# Patient Record
Sex: Female | Born: 1960
Health system: Southern US, Community
[De-identification: ages and names within clinical notes are randomized; demographics above are authoritative.]

## PROBLEM LIST (undated history)

## (undated) HISTORY — PX: HERNIA REPAIR: SHX51

## (undated) HISTORY — PX: KNEE SURGERY: SHX244

---

## 1998-04-25 ENCOUNTER — Other Ambulatory Visit: Admission: RE | Admit: 1998-04-25 | Discharge: 1998-04-25 | Payer: Self-pay | Admitting: Obstetrics and Gynecology

## 1999-05-23 ENCOUNTER — Other Ambulatory Visit: Admission: RE | Admit: 1999-05-23 | Discharge: 1999-05-23 | Payer: Self-pay | Admitting: Obstetrics and Gynecology

## 2000-07-08 ENCOUNTER — Other Ambulatory Visit: Admission: RE | Admit: 2000-07-08 | Discharge: 2000-07-08 | Payer: Self-pay | Admitting: Obstetrics and Gynecology

## 2001-08-11 ENCOUNTER — Other Ambulatory Visit: Admission: RE | Admit: 2001-08-11 | Discharge: 2001-08-11 | Payer: Self-pay | Admitting: Obstetrics and Gynecology

## 2002-08-25 ENCOUNTER — Other Ambulatory Visit: Admission: RE | Admit: 2002-08-25 | Discharge: 2002-08-25 | Payer: Self-pay | Admitting: Obstetrics and Gynecology

## 2002-10-12 ENCOUNTER — Encounter: Payer: Self-pay | Admitting: Obstetrics and Gynecology

## 2002-10-12 ENCOUNTER — Encounter: Admission: RE | Admit: 2002-10-12 | Discharge: 2002-10-12 | Payer: Self-pay | Admitting: Obstetrics and Gynecology

## 2013-08-31 ENCOUNTER — Emergency Department: Payer: Self-pay | Admitting: Emergency Medicine

## 2013-08-31 LAB — URINALYSIS, COMPLETE
Bacteria: NONE SEEN
Bilirubin,UR: NEGATIVE
Blood: NEGATIVE
Glucose,UR: NEGATIVE mg/dL (ref 0–75)
Nitrite: NEGATIVE
Ph: 8 (ref 4.5–8.0)
RBC,UR: 16 /HPF (ref 0–5)
Squamous Epithelial: 9

## 2013-08-31 LAB — COMPREHENSIVE METABOLIC PANEL
Albumin: 3.6 g/dL (ref 3.4–5.0)
Alkaline Phosphatase: 61 U/L
Anion Gap: 10 (ref 7–16)
Bilirubin,Total: 0.4 mg/dL (ref 0.2–1.0)
Calcium, Total: 9 mg/dL (ref 8.5–10.1)
Chloride: 104 mmol/L (ref 98–107)
Creatinine: 0.7 mg/dL (ref 0.60–1.30)
EGFR (Non-African Amer.): >60
Glucose: 128 mg/dL — ABNORMAL HIGH (ref 65–99)
Potassium: 3 mmol/L — ABNORMAL LOW (ref 3.5–5.1)

## 2013-08-31 LAB — CBC
HCT: 35.9 % (ref 35.0–47.0)
MCH: 28.4 pg (ref 26.0–34.0)
MCHC: 33.4 g/dL (ref 32.0–36.0)
MCV: 85 fL (ref 80–100)
Platelet: 299 10*3/uL (ref 150–440)
RDW: 13 % (ref 11.5–14.5)
WBC: 9.7 10*3/uL (ref 3.6–11.0)

## 2013-08-31 LAB — LIPASE, BLOOD: Lipase: 138 U/L (ref 73–393)

## 2013-08-31 LAB — TROPONIN I: Troponin-I: <0.02 ng/mL

## 2014-11-12 ENCOUNTER — Ambulatory Visit (INDEPENDENT_AMBULATORY_CARE_PROVIDER_SITE_OTHER): Payer: Self-pay | Admitting: Surgery

## 2014-11-12 NOTE — H&P (Signed)
Karina Swanson. Schrimpf 11/12/2014 10:32 AM Location: [facility] Patient #: [id number] DOB: November 04, 1960 Married / Language: [name] / Race: White Female History of Present Illness Karina Swanson A. Karina Swanson; 11/12/2014 10:42 AM) Patient words: hernia.  The patient is a 54 year old female who presents with an inguinal hernia. The hernia(s) is/are located on the right side. No changes in management were made at the last visit. Symptoms include inguinal bulge, while symptoms do not include inguinal pain. Onset was gradual 2 week(s) ago. There is no known event that preceded symptom onset. The patient describes this as mild. Past Surgical History Karina Swanson, Swanson; 11/12/2014 10:32 AM) Cesarean Section - 1  Diagnostic Studies History Karina Swanson, Swanson; 11/12/2014 10:32 AM) Colonoscopy never Mammogram 1-3 years ago Pap Smear 1-5 years ago  Allergies Karina Swanson, Swanson; 11/12/2014 10:33 AM) Codeine Phosphate *ANALGESICS - OPIOID*  Medication History Karina Swanson, Swanson; 11/12/2014 10:33 AM) No Current Medications Medications Reconciled  Social History Karina Swanson, Swanson; 11/12/2014 10:32 AM) Caffeine use Coffee. No alcohol use No drug use Tobacco use Never smoker.  Family History Karina Swanson, Swanson; 11/12/2014 10:32 AM) Breast Cancer Mother.  Pregnancy / Birth History Karina Swanson, Swanson; 11/12/2014 10:32 AM) Age at menarche 12 years. Age of menopause 15-55 Gravida 1 Maternal age 62-25 Para 1     Review of Systems Karina Swanson Swanson; 11/12/2014 10:32 AM) General Not Present- Appetite Loss, Chills, Fatigue, Fever, Night Sweats, Weight Gain and Weight Loss. Skin Not Present- Change in Wart/Mole, Dryness, Hives, Jaundice, New Lesions, Non-Healing Wounds, Rash and Ulcer. HEENT Not Present- Earache, Hearing Loss, Hoarseness, Nose Bleed, Oral Ulcers, Ringing in the Ears, Seasonal Allergies, Sinus Pain, Sore Throat, Visual Disturbances, Wears glasses/contact lenses and Yellow  Eyes. Respiratory Not Present- Bloody sputum, Chronic Cough, Difficulty Breathing, Snoring and Wheezing. Breast Not Present- Breast Mass, Breast Pain, Nipple Discharge and Skin Changes. Cardiovascular Not Present- Chest Pain, Difficulty Breathing Lying Down, Leg Cramps, Palpitations, Rapid Heart Rate, Shortness of Breath and Swelling of Extremities. Gastrointestinal Not Present- Abdominal Pain, Bloating, Bloody Stool, Change in Bowel Habits, Chronic diarrhea, Constipation, Difficulty Swallowing, Excessive gas, Gets full quickly at meals, Hemorrhoids, Indigestion, Nausea, Rectal Pain and Vomiting. Female Genitourinary Not Present- Frequency, Nocturia, Painful Urination, Pelvic Pain and Urgency. Musculoskeletal Not Present- Back Pain, Joint Pain, Joint Stiffness, Muscle Pain, Muscle Weakness and Swelling of Extremities.  Vitals (Karina Swanson; 11/12/2014 10:33 AM) 11/12/2014 10:32 AM Weight: 117 lb Height: 60in Body Surface Area: 1.5 m Body Mass Index: 22.85 kg/m Temp.: 97.61F(Temporal)  Pulse: 77 (Regular)  BP: 124/72 (Sitting, Left Arm, Standard)     Physical Exam (Karina Swanson A. Karina Swanson; 11/12/2014 10:43 AM)  General Mental Status-Alert. General Appearance-Consistent with stated age. Hydration-Well hydrated. Voice-Normal.  Head and Neck Head-normocephalic, atraumatic with no lesions or palpable masses. Trachea-midline. Thyroid Gland Characteristics - normal size and consistency.  Chest and Lung Exam Chest and lung exam reveals -quiet, even and easy respiratory effort with no use of accessory muscles and on auscultation, normal breath sounds, no adventitious sounds and normal vocal resonance. Inspection Chest Wall - Normal. Back - normal.  Cardiovascular Cardiovascular examination reveals -normal heart sounds, regular rate and rhythm with no murmurs and normal pedal pulses bilaterally.  Abdomen Note: rRIGHT INGUINAL HERNIA REDUCIBLE NON  TENDER    Neurologic Neurologic evaluation reveals -alert and oriented x 3 with no impairment of recent or remote memory. Mental Status-Normal.  Musculoskeletal Normal Exam - Left-Upper Extremity Strength Normal and Lower Extremity Strength Normal. Normal Exam -  Right-Upper Extremity Strength Normal and Lower Extremity Strength Normal.  Lymphatic Head & Neck  General Head & Neck Lymphatics: Bilateral - Description - Normal. Axillary  General Axillary Region: Bilateral - Description - Normal. Tenderness - Non Tender. Femoral & Inguinal  Generalized Femoral & Inguinal Lymphatics: Bilateral - Description - Normal. Tenderness - Non Tender.    Assessment & Plan (Karina Swanson A. Karina Swanson; 11/12/2014 10:45 AM)  RIGHT INGUINAL HERNIA (550.90  K40.90) Impression: DISCUSSED OBSERVATION VS REPAIR.Karina Swanson. The risk of hernia repair include bleeding, infection, organ injury, bowel injury, bladder injury, nerve injury recurrent hernia, blood clots, worsening of underlying condition, chronic pain, mesh use, open surgery, death, and the need for other operattions. Pt agrees to proceed  Current Plans Pt Education - [facility] Umbilical/ Inguinal Hernia [facility]CI

## 2016-02-14 ENCOUNTER — Ambulatory Visit: Payer: BLUE CROSS/BLUE SHIELD | Admitting: Sports Medicine

## 2016-02-16 ENCOUNTER — Ambulatory Visit: Payer: Self-pay | Admitting: Podiatry

## 2017-02-13 ENCOUNTER — Ambulatory Visit: Payer: BLUE CROSS/BLUE SHIELD | Admitting: Podiatry

## 2018-02-12 ENCOUNTER — Other Ambulatory Visit: Payer: Self-pay

## 2018-02-12 ENCOUNTER — Ambulatory Visit: Payer: BLUE CROSS/BLUE SHIELD | Admitting: Podiatry

## 2018-02-12 ENCOUNTER — Encounter: Payer: Self-pay | Admitting: Podiatry

## 2018-02-12 DIAGNOSIS — B351 Tinea unguium: Secondary | ICD-10-CM

## 2018-02-12 NOTE — Progress Notes (Signed)
   Subjective:    Patient ID: Karina Swanson, female    DOB: 09/09/1961, 57 y.o.   MRN: [id number]  HPI    Review of Systems  All other systems reviewed and are negative.      Objective:   Physical Exam        Assessment & Plan:

## 2018-02-14 NOTE — Progress Notes (Signed)
Subjective:   Patient ID: Karina ArcherAlexandra Tobia, female   DOB: 57 y.o.   MRN: [id number]   HPI Patient presents stating that there is been discoloration of the second nail left and other nails are slightly discolored and she is interested in laser treatment.  Patient does not want oral medication   ROS      Objective:  Physical Exam  Neurovascular status intact with patient noted to have thickness of the second nail left is localized with no proximal edema erythema or drainage noted     Assessment:  Probability of trauma with possible mycotic nail infection also present     Plan:  Reviewed condition with patient and recommended that we try laser even though I explained trauma and there is no guarantee that this will make a big difference for the patient.  Patient wants to pursue this treatment option and at this time is scheduled for laser therapy

## 2018-02-17 ENCOUNTER — Ambulatory Visit: Payer: Self-pay

## 2018-02-17 DIAGNOSIS — B351 Tinea unguium: Secondary | ICD-10-CM

## 2018-02-21 NOTE — Progress Notes (Signed)
Pt presents with mycotic infection of nails 1-5 bilateral  All other systems are negative  Laser therapy administered to affected nails and tolerated well. All safety precautions were in place. Re-appointed in 4 weeks for 2nd treatment 

## 2018-03-03 ENCOUNTER — Ambulatory Visit (INDEPENDENT_AMBULATORY_CARE_PROVIDER_SITE_OTHER): Payer: BLUE CROSS/BLUE SHIELD

## 2018-03-03 DIAGNOSIS — B351 Tinea unguium: Secondary | ICD-10-CM

## 2018-03-05 NOTE — Progress Notes (Signed)
Pt presents with mycotic infection of nails 1-5 bilateral  All other systems are negative  Laser therapy administered to affected nails and tolerated well. All safety precautions were in place. Re-appointed in 4 weeks for 3rd treatment 

## 2018-05-28 ENCOUNTER — Ambulatory Visit: Payer: Self-pay

## 2018-05-28 DIAGNOSIS — B351 Tinea unguium: Secondary | ICD-10-CM

## 2018-05-30 MED ORDER — NONFORMULARY OR COMPOUNDED ITEM: 1.0000 g | Freq: Every day | 3 refills | Status: DC

## 2018-05-30 NOTE — Addendum Note (Signed)
Addended by: Marylou MccoyQUINTANA, Ginamarie Banfield L on: 05/30/2018 04:07 PM   Modules accepted: Orders

## 2018-05-30 NOTE — Progress Notes (Signed)
Pt presents with mycotic infection of nails 1-5 bilateral  All other systems are negative  Laser therapy administered to affected nails and tolerated well. All safety precautions were in place. Re-appointed in 4 weeks for 4th treatment 

## 2018-06-25 ENCOUNTER — Other Ambulatory Visit: Payer: BLUE CROSS/BLUE SHIELD

## 2018-06-30 ENCOUNTER — Other Ambulatory Visit: Payer: BLUE CROSS/BLUE SHIELD

## 2018-12-08 ENCOUNTER — Telehealth: Payer: Self-pay | Admitting: Podiatry

## 2018-12-08 MED ORDER — NONFORMULARY OR COMPOUNDED ITEM: 5 refills | Status: DC

## 2018-12-08 NOTE — Telephone Encounter (Signed)
Left message informing pt Dr. Charlsie Merles ordered her toenail compound, it would come from [place] [phone], they would call with coverage and delivery information. Faxed orders to [facility].

## 2018-12-08 NOTE — Addendum Note (Signed)
Addended by: Alphia Kava D on: 12/08/2018 01:33 PM   Modules accepted: Orders

## 2018-12-08 NOTE — Telephone Encounter (Signed)
Patient got laser last year, and now she wants to come back to get laser but until we start back doing laser in the office she would like to know what to do in the meantime. She is requesting a prescription.

## 2019-07-01 ENCOUNTER — Ambulatory Visit: Payer: BC Managed Care – PPO | Admitting: Podiatry

## 2019-08-24 ENCOUNTER — Other Ambulatory Visit: Payer: Self-pay | Admitting: Family Medicine

## 2019-08-24 DIAGNOSIS — Z1231 Encounter for screening mammogram for malignant neoplasm of breast: Secondary | ICD-10-CM

## 2020-05-30 ENCOUNTER — Emergency Department: Payer: BC Managed Care – PPO

## 2020-05-30 ENCOUNTER — Encounter: Payer: Self-pay | Admitting: Emergency Medicine

## 2020-05-30 ENCOUNTER — Other Ambulatory Visit: Payer: Self-pay

## 2020-05-30 ENCOUNTER — Inpatient Hospital Stay
Admission: EM | Admit: 2020-05-30 | Discharge: 2020-06-06 | DRG: 640 | Disposition: A | Discharge status: Home / Self Care | Payer: BC Managed Care – PPO | Attending: Internal Medicine | Admitting: Internal Medicine

## 2020-05-30 DIAGNOSIS — E871 Hypo-osmolality and hyponatremia: Secondary | ICD-10-CM | POA: Diagnosis present

## 2020-05-30 DIAGNOSIS — Z79899 Other long term (current) drug therapy: Secondary | ICD-10-CM

## 2020-05-30 DIAGNOSIS — J9621 Acute and chronic respiratory failure with hypoxia: Secondary | ICD-10-CM | POA: Diagnosis not present

## 2020-05-30 DIAGNOSIS — R4182 Altered mental status, unspecified: Secondary | ICD-10-CM

## 2020-05-30 DIAGNOSIS — G9341 Metabolic encephalopathy: Secondary | ICD-10-CM | POA: Diagnosis present

## 2020-05-30 DIAGNOSIS — U071 COVID-19: Secondary | ICD-10-CM | POA: Diagnosis not present

## 2020-05-30 DIAGNOSIS — R630 Anorexia: Secondary | ICD-10-CM | POA: Diagnosis present

## 2020-05-30 DIAGNOSIS — J9601 Acute respiratory failure with hypoxia: Secondary | ICD-10-CM | POA: Diagnosis not present

## 2020-05-30 DIAGNOSIS — E876 Hypokalemia: Secondary | ICD-10-CM | POA: Diagnosis not present

## 2020-05-30 DIAGNOSIS — Z885 Allergy status to narcotic agent status: Secondary | ICD-10-CM

## 2020-05-30 DIAGNOSIS — E860 Dehydration: Secondary | ICD-10-CM | POA: Diagnosis present

## 2020-05-30 DIAGNOSIS — E861 Hypovolemia: Secondary | ICD-10-CM | POA: Diagnosis present

## 2020-05-30 LAB — CBC WITH DIFFERENTIAL/PLATELET
Abs Immature Granulocytes: 0.07 10*3/uL (ref 0.00–0.07)
Basophils Absolute: 0 10*3/uL (ref 0.0–0.1)
Basophils Relative: 0 %
Eosinophils Absolute: 0 10*3/uL (ref 0.0–0.5)
Eosinophils Relative: 0 %
HCT: 34.5 % — ABNORMAL LOW (ref 36.0–46.0)
Hemoglobin: 12.8 g/dL (ref 12.0–15.0)
Immature Granulocytes: 1 %
Lymphocytes Relative: 11 %
Lymphs Abs: 1.1 10*3/uL (ref 0.7–4.0)
MCH: 28.9 pg (ref 26.0–34.0)
MCHC: 37.1 g/dL — ABNORMAL HIGH (ref 30.0–36.0)
MCV: 77.9 fL — ABNORMAL LOW (ref 80.0–100.0)
Monocytes Absolute: 0.4 10*3/uL (ref 0.1–1.0)
Monocytes Relative: 4 %
Neutro Abs: 8.6 10*3/uL — ABNORMAL HIGH (ref 1.7–7.7)
Neutrophils Relative %: 84 %
Platelets: 255 10*3/uL (ref 150–400)
RBC: 4.43 MIL/uL (ref 3.87–5.11)
RDW: 11.5 % (ref 11.5–15.5)
WBC: 10.2 10*3/uL (ref 4.0–10.5)
nRBC: 0 % (ref 0.0–0.2)

## 2020-05-30 LAB — COMPREHENSIVE METABOLIC PANEL
ALT: 51 U/L — ABNORMAL HIGH (ref 0–44)
AST: 86 U/L — ABNORMAL HIGH (ref 15–41)
Albumin: 3.7 g/dL (ref 3.5–5.0)
Alkaline Phosphatase: 88 U/L (ref 38–126)
Anion gap: 13 (ref 5–15)
BUN: <5 mg/dL — ABNORMAL LOW (ref 6–20)
CO2: 25 mmol/L (ref 22–32)
Calcium: 8.3 mg/dL — ABNORMAL LOW (ref 8.9–10.3)
Chloride: 72 mmol/L — ABNORMAL LOW (ref 98–111)
Creatinine, Ser: 0.35 mg/dL — ABNORMAL LOW (ref 0.44–1.00)
GFR calc Af Amer: >60 mL/min (ref >60)
GFR calc non Af Amer: >60 mL/min (ref >60)
Glucose, Bld: 143 mg/dL — ABNORMAL HIGH (ref 70–99)
Potassium: 2.9 mmol/L — ABNORMAL LOW (ref 3.5–5.1)
Sodium: 110 mmol/L — CL (ref 135–145)
Total Bilirubin: 0.7 mg/dL (ref 0.3–1.2)
Total Protein: 7.1 g/dL (ref 6.5–8.1)

## 2020-05-30 LAB — SODIUM
Sodium: 115 mmol/L — CL (ref 135–145)
Sodium: 117 mmol/L — CL (ref 135–145)
Sodium: 118 mmol/L — CL (ref 135–145)

## 2020-05-30 LAB — URINALYSIS, COMPLETE (UACMP) WITH MICROSCOPIC
Bacteria, UA: NONE SEEN
Bilirubin Urine: NEGATIVE
Glucose, UA: 50 mg/dL — AB
Ketones, ur: 20 mg/dL — AB
Nitrite: NEGATIVE
Protein, ur: >=300 mg/dL — AB
Specific Gravity, Urine: 1.018 (ref 1.005–1.030)
pH: 6 (ref 5.0–8.0)

## 2020-05-30 LAB — PROCALCITONIN: Procalcitonin: 0.18 ng/mL

## 2020-05-30 LAB — SODIUM, URINE, RANDOM: Sodium, Ur: 19 mmol/L

## 2020-05-30 LAB — SARS CORONAVIRUS 2 BY RT PCR (HOSPITAL ORDER, PERFORMED IN ~~LOC~~ HOSPITAL LAB): SARS Coronavirus 2: POSITIVE — AB

## 2020-05-30 LAB — LIPID PANEL
Cholesterol: 147 mg/dL (ref 0–200)
HDL: 60 mg/dL (ref >40)
LDL Cholesterol: 69 mg/dL (ref 0–99)
Total CHOL/HDL Ratio: 2.5 RATIO
Triglycerides: 90 mg/dL (ref <150)
VLDL: 18 mg/dL (ref 0–40)

## 2020-05-30 LAB — OSMOLALITY, URINE: Osmolality, Ur: 457 mOsm/kg (ref 300–900)

## 2020-05-30 LAB — GLUCOSE, CAPILLARY: Glucose-Capillary: 149 mg/dL — ABNORMAL HIGH (ref 70–99)

## 2020-05-30 LAB — OSMOLALITY: Osmolality: 237 mOsm/kg — CL (ref 275–295)

## 2020-05-30 MED ORDER — ALBUTEROL SULFATE HFA 108 (90 BASE) MCG/ACT IN AERS
2.0000 | INHALATION_SPRAY | Freq: Once | RESPIRATORY_TRACT | Status: DC | PRN
  Filled 2020-05-30: qty 6.7

## 2020-05-30 MED ORDER — SODIUM CHLORIDE 0.9 % IV SOLN
INTRAVENOUS | Status: DC | PRN
  Administered 2020-06-03: 500 mL via INTRAVENOUS

## 2020-05-30 MED ORDER — ACETAMINOPHEN 325 MG PO TABS
650.0000 mg | ORAL_TABLET | ORAL | Status: DC | PRN
  Administered 2020-05-30 – 2020-05-31 (×2): 650 mg via ORAL
  Filled 2020-05-30 (×2): qty 2

## 2020-05-30 MED ORDER — METHYLPREDNISOLONE SODIUM SUCC 125 MG IJ SOLR: 125.0000 mg | Freq: Once | INTRAMUSCULAR | Status: DC | PRN

## 2020-05-30 MED ORDER — ONDANSETRON HCL 4 MG/2ML IJ SOLN: 4.0000 mg | Freq: Four times a day (QID) | INTRAMUSCULAR | Status: DC | PRN

## 2020-05-30 MED ORDER — SODIUM CHLORIDE 3 % IV SOLN
INTRAVENOUS | Status: DC
  Filled 2020-05-30: qty 500

## 2020-05-30 MED ORDER — SODIUM CHLORIDE 0.9 % IV SOLN
250.0000 mL | INTRAVENOUS | Status: DC | PRN
  Administered 2020-06-02: 250 mL via INTRAVENOUS

## 2020-05-30 MED ORDER — HEPARIN SODIUM (PORCINE) 5000 UNIT/ML IJ SOLN
5000.0000 [IU] | Freq: Three times a day (TID) | INTRAMUSCULAR | Status: DC
  Administered 2020-05-30 – 2020-06-02 (×3): 5000 [IU] via SUBCUTANEOUS
  Filled 2020-05-30 (×4): qty 1

## 2020-05-30 MED ORDER — POLYETHYLENE GLYCOL 3350 17 G PO PACK: 17.0000 g | PACK | Freq: Every day | ORAL | Status: DC | PRN

## 2020-05-30 MED ORDER — DOCUSATE SODIUM 100 MG PO CAPS: 100.0000 mg | ORAL_CAPSULE | Freq: Two times a day (BID) | ORAL | Status: DC | PRN

## 2020-05-30 MED ORDER — FAMOTIDINE IN NACL 20-0.9 MG/50ML-% IV SOLN
20.0000 mg | Freq: Two times a day (BID) | INTRAVENOUS | Status: DC
  Administered 2020-05-30 – 2020-06-02 (×3): 20 mg via INTRAVENOUS
  Filled 2020-05-30 (×5): qty 50

## 2020-05-30 MED ORDER — SODIUM CHLORIDE 0.9% FLUSH
3.0000 mL | Freq: Two times a day (BID) | INTRAVENOUS | Status: DC
  Administered 2020-05-31 – 2020-06-06 (×11): 3 mL via INTRAVENOUS

## 2020-05-30 MED ORDER — SODIUM CHLORIDE 0.9% FLUSH: 3.0000 mL | INTRAVENOUS | Status: DC | PRN

## 2020-05-30 MED ORDER — DIPHENHYDRAMINE HCL 50 MG/ML IJ SOLN: 50.0000 mg | Freq: Once | INTRAMUSCULAR | Status: DC | PRN

## 2020-05-30 MED ORDER — FAMOTIDINE IN NACL 20-0.9 MG/50ML-% IV SOLN: 20.0000 mg | Freq: Once | INTRAVENOUS | Status: DC | PRN

## 2020-05-30 MED ORDER — POTASSIUM CHLORIDE 10 MEQ/100ML IV SOLN
10.0000 meq | INTRAVENOUS | Status: AC
  Administered 2020-05-31 (×4): 10 meq via INTRAVENOUS
  Filled 2020-05-30 (×4): qty 100

## 2020-05-30 MED ORDER — EPINEPHRINE 0.3 MG/0.3ML IJ SOAJ
0.3000 mg | Freq: Once | INTRAMUSCULAR | Status: DC | PRN
  Filled 2020-05-30: qty 0.3

## 2020-05-30 MED ORDER — SODIUM CHLORIDE 0.9 % IV SOLN: 1200.0000 mg | Freq: Once | INTRAVENOUS | Status: DC

## 2020-05-30 NOTE — ED Notes (Signed)
Resumed care from [place] j rn.  This rn walked into room to find pt standing/ states I have to pee.  Pt taken off monitor and is using the BR.  Pt alert.  Iv in place.

## 2020-05-30 NOTE — ED Triage Notes (Addendum)
Pt here because son called EMS and felt she was confused this AM.  Had covid test last week but per pt has not gotten results.  Test was done at [facility] physicians.  Jeanene Erb and spoke with [facility] and report test done 9/16 but do not have pt results.  VSS at this time.  Unlabored respirations. Pt oriented and able to provide all information but at times is slow to answer.  Has had cough. No NVD. Poor appetite and decreased oral intake.  No pain. Warm dry and pink. Color WNL

## 2020-05-30 NOTE — ED Provider Notes (Signed)
[facility] Emergency Department Provider Note   ____________________________________________    I have reviewed the triage vital signs and the nursing notes.   HISTORY  Chief Complaint Altered Mental Status   History limited by altered mental status  HPI Karina Swanson is a 59 y.o. female who presents with reports of altered mental status.  Reportedly patient had been feeling ill and had Covid test performed by her family physician last week, the results of that test are not available.  Family became concerned because today patient was confused.  Patient does realize that she is having some difficulty with memory.  She denies loss of taste or smell.  Is having chills.  History reviewed. No pertinent past medical history.  There are no problems to display for this patient.   Past Surgical History:  Procedure Laterality Date  . CESAREAN SECTION    . HERNIA REPAIR    . KNEE SURGERY      Prior to Admission medications   Medication Sig Start Date End Date Taking? Authorizing Provider  acetaminophen (TYLENOL) 325 MG tablet Take by mouth. 04/11/17   [provider]  atorvastatin (LIPITOR) 40 MG tablet TK 1 T PO QD 03/27/16   [provider]  Cholecalciferol (VITAMIN D) 2000 units tablet Take by mouth.    [provider]  ketoconazole (NIZORAL) 2 % shampoo U UTD PRN 03/07/17   [provider]  NONFORMULARY OR COMPOUNDED ITEM Apply 1-2 g topically daily. [employer] Nail lacquer: Fluconazole 2%, Terbinafine 1%, DMSO 05/30/18   Lenn Sink, DPM  NONFORMULARY OR COMPOUNDED ITEM [facility]:  Antifungal Cream - Terbinafine 3%, Fluconazole 2%, Tea Tree Oil 5%, Urea 10%, Ibuprofen 2%, in DMSO #83ml suspension. Apply to affected toenail(s) once (at bedtime) or twice daily. 12/08/18   Lenn Sink, DPM  oxyCODONE (OXY IR/ROXICODONE) 5 MG immediate release tablet Take by mouth. 04/11/17   [provider]      Allergies Codeine  History reviewed. No pertinent family history.  Social History Social History   Tobacco Use  . Smoking status: Never Smoker  . Smokeless tobacco: Never Used  Substance Use Topics  . Alcohol use: Never  . Drug use: Never    Review of Systems limited by altered mental status  Constitutional: Positive chills Eyes: No visual changes.  ENT: No sore throat. Cardiovascular: Denies chest pain. Respiratory: No cough reported Gastrointestinal: No vomiting Genitourinary: No dysuria Musculoskeletal: Positive myalgias Skin: Negative for rash. Neurological: Negative for headaches    ____________________________________________   PHYSICAL EXAM:  VITAL SIGNS: ED Triage Vitals  Enc Vitals Group     BP 05/30/20 1146 130/64     Pulse Rate 05/30/20 1146 80     Resp 05/30/20 1146 16     Temp --      Temp src --      SpO2 05/30/20 1146 97 %     Weight 05/30/20 1149 54.4 kg (120 lb)     Height 05/30/20 1149 1.524 m (5')     Head Circumference --      Peak Flow --      Pain Score 05/30/20 1148 0     Pain Loc --      Pain Edu? --      Excl. in GC? --     Constitutional: Alert and oriented but slow to respond and somewhat confused Eyes: Conjunctivae are normal.  Head: Atraumatic. Nose: No congestion/rhinnorhea. Mouth/Throat: Mucous membranes are moist.  Cardiovascular: Normal rate, regular rhythm. Grossly normal heart sounds.  Good peripheral circulation. Respiratory: Normal respiratory effort.  No retractions. Lungs CTAB. Gastrointestinal: Soft and nontender. No distention.  No CVA tenderness.  Musculoskeletal: No lower extremity tenderness nor edema.  Warm and well perfused Neurologic:  Normal speech and language. No gross focal neurologic deficits are appreciated.  Cranial nerves II to XII are normal Skin:  Skin is warm, dry and intact. No rash noted. Psychiatric: Mood and affect are normal. Speech and behavior are  normal.  ____________________________________________   LABS (all labs ordered are listed, but only abnormal results are displayed)  Labs Reviewed  COMPREHENSIVE METABOLIC PANEL - Abnormal; Notable for the following components:      Result Value   Sodium 110 (*)    Potassium 2.9 (*)    Chloride 72 (*)    Glucose, Bld 143 (*)    BUN <5 (*)    Creatinine, Ser 0.35 (*)    Calcium 8.3 (*)    AST 86 (*)    ALT 51 (*)    All other components within normal limits  CBC WITH DIFFERENTIAL/PLATELET - Abnormal; Notable for the following components:   HCT 34.5 (*)    MCV 77.9 (*)    MCHC 37.1 (*)    Neutro Abs 8.6 (*)    All other components within normal limits  URINALYSIS, COMPLETE (UACMP) WITH MICROSCOPIC - Abnormal; Notable for the following components:   Color, Urine YELLOW (*)    APPearance HAZY (*)    Glucose, UA 50 (*)    Hgb urine dipstick SMALL (*)    Ketones, ur 20 (*)    Protein, ur >=300 (*)    Leukocytes,Ua TRACE (*)    All other components within normal limits  GLUCOSE, CAPILLARY - Abnormal; Notable for the following components:   Glucose-Capillary 149 (*)    All other components within normal limits  SARS CORONAVIRUS 2 BY RT PCR (HOSPITAL ORDER, PERFORMED IN  HOSPITAL LAB)  SODIUM, URINE, RANDOM  OSMOLALITY, URINE  SODIUM  SODIUM  SODIUM  OSMOLALITY   ____________________________________________  EKG  ED ECG REPORT I, Jene Every, the attending physician, personally viewed and interpreted this ECG.  Date: 05/30/2020  Rhythm: normal sinus rhythm QRS Axis: normal Intervals: normal ST/T Wave abnormalities: Nonspecific changes Narrative Interpretation: no evidence of acute ischemia  ____________________________________________  RADIOLOGY  Chest x-ray reviewed by me, no infiltrate effusion or pneumothorax ____________________________________________   PROCEDURES  Procedure(s) performed: No  Procedures   Critical Care performed:  yes  CRITICAL CARE Performed by: Jene Every   Total critical care time: 35 minutes  Critical care time was exclusive of separately billable procedures and treating other patients.  Critical care was necessary to treat or prevent imminent or life-threatening deterioration.  Critical care was time spent personally by me on the following activities: development of treatment plan with patient and/or surrogate as well as nursing, discussions with consultants, evaluation of patient's response to treatment, examination of patient, obtaining history from patient or surrogate, ordering and performing treatments and interventions, ordering and review of laboratory studies, ordering and review of radiographic studies, pulse oximetry and re-evaluation of patient's condition.  ____________________________________________   INITIAL IMPRESSION / ASSESSMENT AND PLAN / ED COURSE  Pertinent labs & imaging results that were available during my care of the patient were reviewed by me and considered in my medical decision making (see chart for details).  Patient presents with confusion as noted above.  Recent Covid test  is certainly concerning for possible COVID-19 as the cause of her altered mental status, versus metabolic encephalopathy from pneumonia or urinary tract infection.  Review of medical records demonstrates no recent admissions  Patient's vitals are overall reassuring however she clearly has some memory issues on exam.  Patient has severe hyponatremia on chemistries of 110, this is likely the cause of her confusion.  Chest x-ray is without infiltrate or effusion.  Covid test is pending.  Urinalysis demonstrates trace leukocytes  Discussed with Dr. Wynelle Link of nephrology who recommends hypertonic saline drip at 20 cc/h, this has been ordered  Discussed with Dr. Belia Heman of ICU who will admit the patient    ____________________________________________   FINAL CLINICAL IMPRESSION(S) / ED  DIAGNOSES  Final diagnoses:  Hyponatremia  Altered mental status, unspecified altered mental status type        Note:  This document was prepared using Dragon voice recognition software and may include unintentional dictation errors.   Jene Every, MD 05/30/20 [phone]

## 2020-05-30 NOTE — ED Notes (Signed)
Radiology reports pt became very diaphoretic while in xray and was dripping sweat, her shirt became completely wet.  Pt pulled and blood sugar done, ekg ordered.

## 2020-05-30 NOTE — H&P (Signed)
Name: Karina Swanson MRN: [id number] DOB: 09-06-1961    ADMISSION DATE:  05/30/2020 CONSULTATION DATE:  05/30/2020  REFERRING MD :  Dr. Cyril Loosen   CHIEF COMPLAINT:  Altered Mental Status  BRIEF PATIENT DESCRIPTION:  59 y.o. Female admitted with Acute Metabolic Encephalopathy in the setting of Hypotonic Hypovolemic Hyponatremia requiring Hypertonic Saline.  Also found to have COVID-19, but pt without hypoxia or respiratory symptoms, and has normal CXR.  Nephrology following along.  SIGNIFICANT EVENTS  9/20: Presented to ED due to AMS; found to have severe Hyponatremia 9/20: Placed on 3% NS, Nephrology consulted; Hypertonic saline d/c due to rate of correction 9/20: PCCM asked to admit to ICU  STUDIES:  9/20: CXR>>The heart size and mediastinal contours are within normal limits. Both lungs are clear. The visualized skeletal structures are unremarkable.  CULTURES: SARS-CoV-2 PCR 9/20>> Positive Urine 9/20>>  ANTIBIOTICS: N/A  HISTORY OF PRESENT ILLNESS:   Karina Swanson is a 59 y.o. Female with no significant medical history listed who presented to [facility] ED on 05/30/2020 due to altered mental status.  Pt is currently unable to contribute to the history and no family is present, therefore history is obtained from ED and nursing notes.  Per notes, she has been having about a 2 week history of fever/chills, cough.  She has a COVID test performed last week on 05/26/2020 by her PCP, but results are not available.  She was reported to be lethargic and confused with poor PO intake since last night, therefore her family brought her to ED.  Upon presentation to the ED, her vital signs are within normal limits (RR 16, HR 85, BP 119/69, 100% SpO2 on room air), and no signs of respiratory symptoms.  Initial workup revealed serum Na 110, potassium 2.9, BUN <5, Creatinine < 0.35, glucose 143, AST 86, ALT 51, WBC 10.2 with Neutrophilia, procalcitonin 0.18, serum osmolality 237, urine osmolality  457, and Urine Na 19.  Her COVID-19 PCR is positive.  Her CXR is normal.  Urinalysis with trace leukocytes.  Nephrology was consulted who recommended placing on 3% Hypertonic Saline infusion at 20 cc/hr.  PCCM is asked to admit the pt to ICU for further workup and treatment of Acute Metabolic Encephalopathy in the setting of Hypotonic Hypovolemic Hyponatremia requiring 3% Hypertonic Saline.  PAST MEDICAL HISTORY :   has no past medical history on file.  has a past surgical history that includes Cesarean section; Hernia repair; and Knee surgery. Prior to Admission medications   Medication Sig Start Date End Date Taking? Authorizing Provider  acetaminophen (TYLENOL) 325 MG tablet Take 650 mg by mouth every 6 (six) hours as needed.   Yes [provider]  Cholecalciferol (VITAMIN D) 2000 units tablet Take 2,000 Units by mouth daily.    Yes [provider]  ibuprofen (ADVIL) 200 MG tablet Take 200 mg by mouth every 6 (six) hours as needed.   Yes [provider]  ondansetron (ZOFRAN-ODT) 4 MG disintegrating tablet Take 4 mg by mouth every 8 (eight) hours as needed for nausea/vomiting. 05/26/20  Yes [provider]  promethazine (PHENERGAN) 25 MG tablet Take 25-50 mg by mouth 3 (three) times daily as needed. 05/29/20  Yes [provider]  vitamin C (ASCORBIC ACID) 250 MG tablet Take 250 mg by mouth daily.   Yes [provider]   Allergies  Allergen Reactions  . Codeine Nausea Only and Nausea And Vomiting    FAMILY HISTORY:  family history is not on file. SOCIAL HISTORY:  reports that she has never smoked. She has never used smokeless tobacco. She reports that she does not drink alcohol and does not use drugs.   COVID-19 DISASTER DECLARATION:  FULL CONTACT PHYSICAL EXAMINATION WAS NOT POSSIBLE DUE TO TREATMENT OF COVID-19 AND  CONSERVATION OF PERSONAL PROTECTIVE EQUIPMENT, LIMITED EXAM FINDINGS INCLUDE-  Patient assessed or the symptoms  described in the history of present illness.  In the context of the Global COVID-19 pandemic, which necessitated consideration that the patient might be at risk for infection with the SARS-CoV-2 virus that causes COVID-19, Institutional protocols and algorithms that pertain to the evaluation of patients at risk for COVID-19 are in a state of rapid change based on information released by regulatory bodies including the CDC and federal and state organizations. These policies and algorithms were followed during the patient's care while in hospital.  REVIEW OF SYSTEMS:  Positives in BOLD: Pt denies all complaints Constitutional: Negative for fever, chills, weight loss, malaise/fatigue and diaphoresis.  HENT: Negative for hearing loss, ear pain, nosebleeds, congestion, sore throat, neck pain, tinnitus and ear discharge.   Eyes: Negative for blurred vision, double vision, photophobia, pain, discharge and redness.  Respiratory: Negative for cough, hemoptysis, sputum production, shortness of breath, wheezing and stridor.   Cardiovascular: Negative for chest pain, palpitations, orthopnea, claudication, leg swelling and PND.  Gastrointestinal: Negative for heartburn, nausea, vomiting, abdominal pain, diarrhea, constipation, blood in stool and melena.  Genitourinary: Negative for dysuria, urgency, frequency, hematuria and flank pain.  Musculoskeletal: Negative for myalgias, back pain, joint pain and falls.  Skin: Negative for itching and rash.  Neurological: Negative for dizziness, tingling, tremors, sensory change, speech change, focal weakness, seizures, loss of consciousness, weakness and headaches.  Endo/Heme/Allergies: Negative for environmental allergies and polydipsia. Does not bruise/bleed easily.  SUBJECTIVE:  Pt denies all complaints (no chest pain, no SOB, no cough, no abdominal pain, no N/V/D, no dysuria) Vital signs WNL On room air   VITAL SIGNS: Pulse Rate:  [73-97] 89 (09/20 1800) Resp:   [14-31] 28 (09/20 1800) BP: (119-141)/(64-87) 131/83 (09/20 1800) SpO2:  [95 %-100 %] 95 % (09/20 1800) Weight:  [54.4 kg] 54.4 kg (09/20 1149)  PHYSICAL EXAMINATION: General:  Acutely ill appearing female, laying in bed, on room air, in NAD Neuro:  Lethargic, arouses to voice, oriented to person and place, follows commands, no focal deficits, speech clear HEENT:  Atraumatic, normocephalic, neck supple, no JVD, pupils PERRL Cardiovascular:  Regular rate and rhythm, s1s2, no M/R/G, 2+ distal pulses Lungs:  Clear to auscultation bilaterally, even, nonlabored, normal effort Abdomen:  Soft, nontender, nondistended, no guarding or rebound tenderness, BS+ x4 Musculoskeletal:  No deformities, normal bulk and tone, no edema Skin:  Warm and dry.  No obvious rashes, lesions, or ulcerations  Recent Labs  Lab 05/30/20 1156 05/30/20 1544 05/30/20 1700  NA 110* 115* 117*  K 2.9*  --   --   CL 72*  --   --   CO2 25  --   --   BUN <5*  --   --   CREATININE 0.35*  --   --   GLUCOSE 143*  --   --    Recent Labs  Lab 05/30/20 1156  HGB 12.8  HCT 34.5*  WBC 10.2  PLT 255   DG Chest 2 View  Result Date: 05/30/2020 CLINICAL DATA:  Cough. EXAM: CHEST - 2 VIEW COMPARISON:  None. FINDINGS: The heart size and mediastinal contours are within normal limits. Both lungs are clear.  The visualized skeletal structures are unremarkable. IMPRESSION: No active cardiopulmonary disease. Electronically Signed   By: Lupita Raider M.D.   On: 05/30/2020 12:50    ASSESSMENT / PLAN:  Acute Metabolic Encephalopathy in the setting of Hypotonic Hypovolemic Hyponatremia (likely in setting of dehydration and poor PO intake) -Monitor I&O's / urinary output -Follow BMP -Ensure adequate renal perfusion -Avoid nephrotoxic agents as able -Replace electrolytes as indicated -Pharmacy following -Nephrology following, appreciate input -Follow serum Na q4h -Goal to increase serum Na by 4-6 mEq/L over the 1st 6 hours; Max  rate of correction of 8 mEq/L in 24 hrs -Initially on 3% Hypertonic Saline, but d/c due to rate of correction -Provide supportive care -Seizure precautions    COVID-19 (pt WITHOUT Hypoxia or Respiratory symptoms, and CXR is normal) -Supplemental O2 if needed to maintain O2 sats >88% -Follow intermittent CXR and ABG as needed -Initial CXR 05/30/20 is normal -Dr. Belia Heman discussed therapies for COVID (including Monoclonal AB and Baricitinib); of which pt's husband REFUSED -Received one time dose of IV Solu-medrol -Given lack of respiratory symptoms, does not meet criteria for Remdesivir or Steroids -Monitor fever curve -Trend WBC's and procalcitonin -Follow inflammatory markers      [facility] GOALS OF CARE: Full Code  DISPOSITION: ICU VTE PROPHYLAXIS: SQ Heparin CONSULTS: Nephrology UPDATES: Pt's husband was updated by Dr. Belia Heman 05/30/20; updated pt at bedside 05/30/2020   Harlon Ditty, AGACNP-BC [facility] Pager: [phone]  05/30/2020, 7:39 PM

## 2020-05-30 NOTE — Consult Note (Signed)
MEDICATION RELATED CONSULT NOTE  Pharmacy Consult for 3% NaCl Monitoring Indication: Severe hyponatremia  Patient Measurements: Height: 5' (152.4 cm) Weight: 54.4 kg (120 lb) IBW/kg (Calculated) : 45.5  Vital Signs: BP: 124/81 (09/20 1930) Pulse Rate: 110 (09/20 1930)  Estimated Creatinine Clearance: 54.4 mL/min (A) (by C-G formula based on SCr of 0.35 mg/dL (L)).   Assessment: Patient is a 59 y/o F who presented to the ED 9/20 with altered mental status. Labs significant for Na 110, Cl 92, K 2.9. Patient is being started on hypertonic saline for symptomatic hyponatremia.   Hypertonic Saline Monitoring:  9/20: 3% NaCl initiated at 20 mL/hr   Goal of Therapy:  Increase Na by 4-6 mmol/L in 4-6 hours Max correction of 12 mmol/L/day  Plan:  --3% NaCl starting at initial rate of 20 mL/hr --Na checks q2h x 2 and then q4h while on drip --Pharmacy will monitor for appropriate rate of correction   Update: Pharmacy continues to follow Na after being stopped shortly after hypertonic 3% solution started. Will continue to follow peripherally.   Katha Cabal 05/30/2020,8:03 PM

## 2020-05-30 NOTE — Progress Notes (Signed)
I have dicussed findings with Husband, patient is COVID +.  I have asked for permission for possible use of Monocloncal AB infusions and also oral Baricitnib, Risks and Benefits relayed to husband, it is under EAU label at this time.  At this time, Patient's Husband has REFUSED and does NOT give consent to administer therapy.

## 2020-05-30 NOTE — Consult Note (Signed)
MEDICATION RELATED CONSULT NOTE  Pharmacy Consult for 3% NaCl Monitoring Indication: Severe hyponatremia  Patient Measurements: Height: 5' (152.4 cm) Weight: 54.4 kg (120 lb) IBW/kg (Calculated) : 45.5  Vital Signs: BP: 119/69 (09/20 1241) Pulse Rate: 80 (09/20 1315)  Estimated Creatinine Clearance: 54.4 mL/min (A) (by C-G formula based on SCr of 0.35 mg/dL (L)).   Assessment: Patient is a 59 y/o F who presented to the ED 9/20 with altered mental status. Labs significant for Na 110, Cl 92, K 2.9. Patient is being started on hypertonic saline for symptomatic hyponatremia.   Hypertonic Saline Monitoring:  9/20: 3% NaCl initiated at 20 mL/hr   Goal of Therapy:  Increase Na by 4-6 mmol/L in 4-6 hours Max correction of 12 mmol/L/day  Plan:  --3% NaCl starting at initial rate of 20 mL/hr --Na checks q2h x 2 and then q4h while on drip --Pharmacy will monitor for appropriate rate of correction   Tressie Ellis 05/30/2020,1:46 PM

## 2020-05-30 NOTE — ED Notes (Signed)
Report off to chrissy rn c pod nurse

## 2020-05-30 NOTE — ED Notes (Signed)
Son reports pt has had some confusion since last night. He thinks it is from phenergan. He reports that she was tested for covid but has not gotten results.  Explained if has covid some confusion is more likely r/t that, one phenergan typically does not cause confusion 12 hr later. Informed him this RN would put note in though about his concern r/t phenergan.  He gave example of asking her if she checked her temp today and she did not know why she needed to.

## 2020-05-30 NOTE — Consult Note (Signed)
[facility]  CONSULT NOTE    Date: 05/30/2020                  Patient Name:  Karina Swanson  MRN: [id number]  DOB: [birth year]  Age / Sex: 59 y.o., female         PCP: Johny Blamer, MD                 Service Requesting Consult: Dr. Cyril Loosen                 Reason for Consult: Hyponatremia            History of Present Illness: Ms. Claudie Brickhouse has been having 2 weeks of fevers/chills, cough and shortness of breath Patient had a covid test last week but these results are not available. She is positive here. Patient unable to give history. History taken from chart. Patient has been lethargic, not eating or drinking. Patient was brought to ED by family after finding her confused.  Serum sodium of 110. Started on 3% hypertonic.  No home medications. Denies use of diuretics, SSRIs. Did not get mannitol.    Medications: Outpatient medications: (Not in a hospital admission)   Current medications: Current Facility-Administered Medications  Medication Dose Route Frequency Provider Last Rate Last Admin  . sodium chloride (hypertonic) 3 % solution   Intravenous Continuous Jene Every, MD       Current Outpatient Medications  Medication Sig Dispense Refill  . Cholecalciferol (VITAMIN D) 2000 units tablet Take 2,000 Units by mouth daily.     Marland Kitchen acetaminophen (TYLENOL) 325 MG tablet Take by mouth.    Marland Kitchen atorvastatin (LIPITOR) 40 MG tablet TK 1 T PO QD    . ketoconazole (NIZORAL) 2 % shampoo U UTD PRN    . NONFORMULARY OR COMPOUNDED ITEM Apply 1-2 g topically daily. [employer] N[employer] lacquer: Fluconazole 2%, Terbinafine 1%, DMSO 120 each 3  . NONFORMULARY OR COMPOUNDED ITEM [facility]:  Antifungal Cream - Terbinafine 3%, Fluconazole 2%, Tea Tree Oil 5%, Urea 10%, Ibuprofen 2%, in DMSO #75ml suspension. Apply to affected toenail(s) once (at bedtime) or twice daily. 30 each 5  . ondansetron (ZOFRAN-ODT) 4 MG disintegrating tablet Take 4 mg by mouth  every 8 (eight) hours as needed for nausea/vomiting.    . promethazine (PHENERGAN) 25 MG tablet Take 25-50 mg by mouth 3 (three) times daily as needed.        Allergies: Allergies  Allergen Reactions  . Codeine Nausea Only and Nausea And Vomiting      Past Medical History: History reviewed. No pertinent past medical history.   Past Surgical History: Past Surgical History:  Procedure Laterality Date  . CESAREAN SECTION    . HERNIA REPAIR    . KNEE SURGERY       Family History: History reviewed. No pertinent family history.   Social History: Social History   Socioeconomic History  . Marital status: Married    Spouse name: Not on file  . Number of children: Not on file  . Years of education: Not on file  . Highest education level: Not on file  Occupational History  . Not on file  Tobacco Use  . Smoking status: Never Smoker  . Smokeless tobacco: Never Used  Substance and Sexual Activity  . Alcohol use: Never  . Drug use: Never  . Sexual activity: Not on file  Other Topics Concern  . Not on file  Social History Narrative  . Not  on file   Social Determinants of Health   Financial Resource Strain:   . Difficulty of Paying Living Expenses: Not on file  Food Insecurity:   . Worried About Programme researcher, broadcasting/film/video in the Last Year: Not on file  . Ran Out of Food in the Last Year: Not on file  Transportation Needs:   . Lack of Transportation (Medical): Not on file  . Lack of Transportation (Non-Medical): Not on file  Physical Activity:   . Days of Exercise per Week: Not on file  . Minutes of Exercise per Session: Not on file  Stress:   . Feeling of Stress : Not on file  Social Connections:   . Frequency of Communication with Friends and Family: Not on file  . Frequency of Social Gatherings with Friends and Family: Not on file  . Attends Religious Services: Not on file  . Active Member of Clubs or Organizations: Not on file  . Attends Banker  Meetings: Not on file  . Marital Status: Not on file  Intimate Partner Violence:   . Fear of Current or Ex-Partner: Not on file  . Emotionally Abused: Not on file  . Physically Abused: Not on file  . Sexually Abused: Not on file     Review of Systems: Review of Systems  Unable to perform ROS: Mental status change    Vital Signs: Blood pressure 137/77, pulse 88, resp. rate (!) 30, height 5' (1.524 m), weight 54.4 kg, SpO2 96 %.  Weight trends: Filed Weights   05/30/20 1149  Weight: 54.4 kg    Physical Exam: General: NAD, laying in bed  Head: Normocephalic, atraumatic. Moist oral mucosal membranes  Eyes: Anicteric, PERRL  Neck: Supple, trachea midline  Lungs:  Clear to auscultation  Heart: Regular rate and rhythm  Abdomen:  Soft, nontender,   Extremities: no peripheral edema.  Neurologic: Alert to self only  Skin: No lesions         Lab results: Basic Metabolic Panel: Recent Labs  Lab 05/30/20 1156  NA 110*  K 2.9*  CL 72*  CO2 25  GLUCOSE 143*  BUN <5*  CREATININE 0.35*  CALCIUM 8.3*    Liver Function Tests: Recent Labs  Lab 05/30/20 1156  AST 86*  ALT 51*  ALKPHOS 88  BILITOT 0.7  PROT 7.1  ALBUMIN 3.7   No results for input(s): LIPASE, AMYLASE in the last 168 hours. No results for input(s): AMMONIA in the last 168 hours.  CBC: Recent Labs  Lab 05/30/20 1156  WBC 10.2  NEUTROABS 8.6*  HGB 12.8  HCT 34.5*  MCV 77.9*  PLT 255    Cardiac Enzymes: No results for input(s): CKTOTAL, CKMB, CKMBINDEX, TROPONINI in the last 168 hours.  BNP: Invalid input(s): POCBNP  CBG: Recent Labs  Lab 05/30/20 1228  GLUCAP 149*    Microbiology: No results found for this or any previous visit.  Coagulation Studies: No results for input(s): LABPROT, INR in the last 72 hours.  Urinalysis: Recent Labs    05/30/20 1323  COLORURINE YELLOW*  LABSPEC 1.018  PHURINE 6.0  GLUCOSEU 50*  HGBUR SMALL*  BILIRUBINUR NEGATIVE  KETONESUR 20*   PROTEINUR >=300*  NITRITE NEGATIVE  LEUKOCYTESUR TRACE*      Imaging: DG Chest 2 View  Result Date: 05/30/2020 CLINICAL DATA:  Cough. EXAM: CHEST - 2 VIEW COMPARISON:  None. FINDINGS: The heart size and mediastinal contours are within normal limits. Both lungs are clear. The visualized skeletal structures are  unremarkable. IMPRESSION: No active cardiopulmonary disease. Electronically Signed   By: Lupita Raider M.D.   On: 05/30/2020 12:50      Assessment & Plan: Ms. Nickol Collister is a 59 y.o. white female with no significant medical history , who was admitted to [facility] on 05/30/2020 for Covid+, dehydrated, EMS  1. Hyponatremia: euvolemic on examination. Urine sodium consistent with hypovolemic hyponatremia - hypertonic saline 73mL/hr - q 2 hour sodium checks - Check TSH, lipid panel.      LOS: 0 Fortunata Betty 9/20/20212:44 PM

## 2020-05-30 NOTE — ED Notes (Addendum)
Hypertonic solution fluids discontinued per dr Wynelle Link verbal order over the phone

## 2020-05-31 ENCOUNTER — Inpatient Hospital Stay: Payer: BC Managed Care – PPO

## 2020-05-31 LAB — COMPREHENSIVE METABOLIC PANEL
ALT: 93 U/L — ABNORMAL HIGH (ref 0–44)
AST: 141 U/L — ABNORMAL HIGH (ref 15–41)
Albumin: 3.4 g/dL — ABNORMAL LOW (ref 3.5–5.0)
Alkaline Phosphatase: 85 U/L (ref 38–126)
Anion gap: 12 (ref 5–15)
BUN: 10 mg/dL (ref 6–20)
CO2: 23 mmol/L (ref 22–32)
Calcium: 8.5 mg/dL — ABNORMAL LOW (ref 8.9–10.3)
Chloride: 80 mmol/L — ABNORMAL LOW (ref 98–111)
Creatinine, Ser: 0.62 mg/dL (ref 0.44–1.00)
GFR calc Af Amer: >60 mL/min (ref >60)
GFR calc non Af Amer: >60 mL/min (ref >60)
Glucose, Bld: 110 mg/dL — ABNORMAL HIGH (ref 70–99)
Potassium: 3.9 mmol/L (ref 3.5–5.1)
Sodium: 115 mmol/L — CL (ref 135–145)
Total Bilirubin: 0.8 mg/dL (ref 0.3–1.2)
Total Protein: 7 g/dL (ref 6.5–8.1)

## 2020-05-31 LAB — CBC WITH DIFFERENTIAL/PLATELET
Abs Immature Granulocytes: 0.07 10*3/uL (ref 0.00–0.07)
Basophils Absolute: 0 10*3/uL (ref 0.0–0.1)
Basophils Relative: 0 %
Eosinophils Absolute: 0 10*3/uL (ref 0.0–0.5)
Eosinophils Relative: 0 %
HCT: 35.8 % — ABNORMAL LOW (ref 36.0–46.0)
Hemoglobin: 13 g/dL (ref 12.0–15.0)
Immature Granulocytes: 1 %
Lymphocytes Relative: 13 %
Lymphs Abs: 1.6 10*3/uL (ref 0.7–4.0)
MCH: 28.4 pg (ref 26.0–34.0)
MCHC: 36.3 g/dL — ABNORMAL HIGH (ref 30.0–36.0)
MCV: 78.3 fL — ABNORMAL LOW (ref 80.0–100.0)
Monocytes Absolute: 0.6 10*3/uL (ref 0.1–1.0)
Monocytes Relative: 5 %
Neutro Abs: 9.7 10*3/uL — ABNORMAL HIGH (ref 1.7–7.7)
Neutrophils Relative %: 81 %
Platelets: 295 10*3/uL (ref 150–400)
RBC: 4.57 MIL/uL (ref 3.87–5.11)
RDW: 11.9 % (ref 11.5–15.5)
WBC: 12.1 10*3/uL — ABNORMAL HIGH (ref 4.0–10.5)
nRBC: 0 % (ref 0.0–0.2)

## 2020-05-31 LAB — FERRITIN: Ferritin: 1497 ng/mL — ABNORMAL HIGH (ref 11–307)

## 2020-05-31 LAB — C-REACTIVE PROTEIN: CRP: 5 mg/dL — ABNORMAL HIGH (ref <1.0)

## 2020-05-31 LAB — OSMOLALITY: Osmolality: 249 mOsm/kg — CL (ref 275–295)

## 2020-05-31 LAB — SODIUM
Sodium: 116 mmol/L — CL (ref 135–145)
Sodium: 119 mmol/L — CL (ref 135–145)
Sodium: 121 mmol/L — ABNORMAL LOW (ref 135–145)
Sodium: 124 mmol/L — ABNORMAL LOW (ref 135–145)

## 2020-05-31 LAB — URINE CULTURE

## 2020-05-31 LAB — HIV ANTIBODY (ROUTINE TESTING W REFLEX): HIV Screen 4th Generation wRfx: NONREACTIVE

## 2020-05-31 LAB — PROCALCITONIN: Procalcitonin: 0.33 ng/mL

## 2020-05-31 LAB — FIBRIN DERIVATIVES D-DIMER (ARMC ONLY): Fibrin derivatives D-dimer (ARMC): 2072.08 ng/mL (FEU) — ABNORMAL HIGH (ref 0.00–499.00)

## 2020-05-31 MED ORDER — DM-GUAIFENESIN ER 30-600 MG PO TB12
1.0000 | ORAL_TABLET | Freq: Two times a day (BID) | ORAL | Status: DC
  Administered 2020-06-01 – 2020-06-06 (×11): 1 via ORAL
  Filled 2020-05-31 (×13): qty 1

## 2020-05-31 MED ORDER — MELATONIN 3 MG PO TABS: 3.0000 mg | ORAL_TABLET | Freq: Every day | ORAL | Status: DC

## 2020-05-31 MED ORDER — MELATONIN 5 MG PO TABS
5.0000 mg | ORAL_TABLET | Freq: Every day | ORAL | Status: DC
  Administered 2020-06-02 – 2020-06-05 (×4): 5 mg via ORAL
  Filled 2020-05-31 (×8): qty 1

## 2020-05-31 MED ORDER — ACETAMINOPHEN 325 MG PO TABS
650.0000 mg | ORAL_TABLET | ORAL | Status: DC | PRN
  Administered 2020-06-01: 650 mg via ORAL
  Filled 2020-05-31 (×2): qty 2

## 2020-05-31 MED ORDER — ZINC SULFATE 220 (50 ZN) MG PO CAPS
220.0000 mg | ORAL_CAPSULE | Freq: Every day | ORAL | Status: DC
  Administered 2020-05-31 – 2020-06-04 (×3): 220 mg via ORAL
  Filled 2020-05-31 (×5): qty 1

## 2020-05-31 MED ORDER — IBUPROFEN 400 MG PO TABS: 800.0000 mg | ORAL_TABLET | ORAL | Status: DC | PRN

## 2020-05-31 MED ORDER — SODIUM CHLORIDE 3 % IV SOLN
INTRAVENOUS | Status: DC
  Filled 2020-05-31: qty 500

## 2020-05-31 MED ORDER — ASCORBIC ACID 500 MG PO TABS
1000.0000 mg | ORAL_TABLET | Freq: Three times a day (TID) | ORAL | Status: DC
  Administered 2020-05-31 – 2020-06-06 (×15): 1000 mg via ORAL
  Filled 2020-05-31 (×17): qty 2

## 2020-05-31 MED ORDER — SODIUM CHLORIDE 0.9 % IV SOLN
1.0000 g | INTRAVENOUS | Status: DC
  Administered 2020-05-31 – 2020-06-03 (×4): 1 g via INTRAVENOUS
  Filled 2020-05-31: qty 10
  Filled 2020-05-31: qty 1
  Filled 2020-05-31: qty 10
  Filled 2020-05-31: qty 1

## 2020-05-31 MED ORDER — SODIUM CHLORIDE 0.9 % IV SOLN
500.0000 mg | INTRAVENOUS | Status: DC
  Administered 2020-05-31 – 2020-06-03 (×4): 500 mg via INTRAVENOUS
  Filled 2020-05-31 (×4): qty 500

## 2020-05-31 MED ORDER — CHOLECALCIFEROL 10 MCG (400 UNIT) PO TABS
400.0000 [IU] | ORAL_TABLET | Freq: Every day | ORAL | Status: DC
  Administered 2020-05-31 – 2020-06-06 (×6): 400 [IU] via ORAL
  Filled 2020-05-31 (×8): qty 1

## 2020-05-31 NOTE — Consult Note (Signed)
MEDICATION RELATED CONSULT NOTE  Pharmacy Consult for 3% NaCl Monitoring Indication: Severe hyponatremia  Patient Measurements: Height: 5' (152.4 cm) Weight: 54.4 kg (120 lb) IBW/kg (Calculated) : 45.5  Vital Signs: Temp: 99 F (37.2 C) (09/21 0100) Temp Source: Oral (09/20 2208) BP: 131/90 (09/21 0630) Pulse Rate: 72 (09/21 0630)  Estimated Creatinine Clearance: 54.4 mL/min (by C-G formula based on SCr of 0.62 mg/dL).   Assessment: Patient is a 59 y/o F who presented to the ED 9/20 with altered mental status. Labs significant for Na 110, Cl 92, K 2.9. Patient was started on hypertonic saline for symptomatic hyponatremia and later discontinued due to rate of Na correction. Now restarting hypertonic saline.  Hypertonic Saline Monitoring:  0920: Na @ 1200 = 110 0920: Na @ 1545 = 115 - 3% NaCl started at 49ml/hr @1606  0920: Na @ 1700 = 117 - 3% NaCl stopped @1709  0920: Na @ 1932 = 118 0921: Na @ 0137 = 116 0921: NA @ 0357 = 115 - restarted at 39ml/hr @ ~0900  Goal of Therapy:  Increase Na by 4-6 mmol/L in 1st 6 hours of therapy Max correction of 8 mmol/L in any 24h period  Plan:  - restart 3% NaCl at initial rate of 15 mL/hr --Na checks q2h x 2 and then q4h  --Pharmacy will monitor for appropriate rate of correction   , PharmD Clinical Pharmacist  05/31/2020   7:47 AM

## 2020-05-31 NOTE — Progress Notes (Signed)
Performed rounding while pt remains in ED.  Nursing reports pt is alert, has been up in room with assistance (remains with generalized weakness).  Requiring no supplemental O2, vital signs stable.      Harlon Ditty, AGACNP-BC [facility] Pager: [phone]

## 2020-05-31 NOTE — Consult Note (Signed)
MEDICATION RELATED CONSULT NOTE  Pharmacy Consult for 3% NaCl Monitoring Indication: Severe hyponatremia  Patient Measurements: Height: 5' (152.4 cm) Weight: 54.4 kg (120 lb) IBW/kg (Calculated) : 45.5  Vital Signs: Temp: 99 F (37.2 C) (09/21 0100) Temp Source: Oral (09/20 2208) BP: 121/78 (09/21 0530) Pulse Rate: 66 (09/21 0530)  Estimated Creatinine Clearance: 54.4 mL/min (by C-G formula based on SCr of 0.62 mg/dL).   Assessment: Patient is a 59 y/o F who presented to the ED 9/20 with altered mental status. Labs significant for Na 110, Cl 92, K 2.9. Patient is being started on hypertonic saline for symptomatic hyponatremia.   Hypertonic Saline Monitoring:  9/20: 3% NaCl initiated at 20 mL/hr   Goal of Therapy:  Increase Na by 4-6 mmol/L in 4-6 hours Max correction of 12 mmol/L/day  Plan:  --3% NaCl starting at initial rate of 20 mL/hr --Na checks q2h x 2 and then q4h while on drip --Pharmacy will monitor for appropriate rate of correction   Update: Pharmacy continues to follow Na after being stopped shortly after hypertonic 3% solution started. Will continue to follow peripherally.  0920: Na @ 1700 = 117 0920: Na @ 1932 = 118 0921: Na @ 0137 = 116 0921: NA @ 0357 = 115  Leonce Bale D 05/31/2020,6:27 AM

## 2020-05-31 NOTE — ED Notes (Signed)
Pt given a [place] tray, ice cream, juice sod and peanut butter crackers.

## 2020-05-31 NOTE — ED Notes (Signed)
Temp is 100.4

## 2020-05-31 NOTE — ED Notes (Signed)
Pt given a second dinner tray to encourage PO intake.

## 2020-05-31 NOTE — ED Notes (Signed)
Per MD no further fluids at this time. Encourage pt to eat a regular diet.

## 2020-05-31 NOTE — ED Notes (Signed)
Temp is 99.4

## 2020-05-31 NOTE — Consult Note (Signed)
MEDICATION RELATED CONSULT NOTE  Pharmacy Consult for 3% NaCl Monitoring Indication: Severe hyponatremia  Patient Measurements: Height: 5' (152.4 cm) Weight: 54.4 kg (120 lb) IBW/kg (Calculated) : 45.5  Vital Signs: Temp: 99 F (37.2 C) (09/21 0100) Temp Source: Oral (09/20 2208) BP: 131/83 (09/21 0200) Pulse Rate: 81 (09/21 0200)  Estimated Creatinine Clearance: 54.4 mL/min (A) (by C-G formula based on SCr of 0.35 mg/dL (L)).   Assessment: Patient is a 59 y/o F who presented to the ED 9/20 with altered mental status. Labs significant for Na 110, Cl 92, K 2.9. Patient is being started on hypertonic saline for symptomatic hyponatremia.   Hypertonic Saline Monitoring:  9/20: 3% NaCl initiated at 20 mL/hr   Goal of Therapy:  Increase Na by 4-6 mmol/L in 4-6 hours Max correction of 12 mmol/L/day  Plan:  --3% NaCl starting at initial rate of 20 mL/hr --Na checks q2h x 2 and then q4h while on drip --Pharmacy will monitor for appropriate rate of correction   Update: Pharmacy continues to follow Na after being stopped shortly after hypertonic 3% solution started. Will continue to follow peripherally.  0920: Na @ 1700 = 117 0920: Na @ 1932 = 118 0921: Na @ 0137 = 116  Karina Swanson D 05/31/2020,2:31 AM

## 2020-05-31 NOTE — Progress Notes (Signed)
[facility]  ROUNDING NOTE   Subjective:   More alert and oriented this morning.   Hypertonic saline discontinued due to rapid correction of sodium.   Objective:  Vital signs in last 24 hours:  Temp:  [99 F (37.2 C)-102.4 F (39.1 C)] 99 F (37.2 C) (09/21 0100) Pulse Rate:  [66-110] 88 (09/21 1000) Resp:  [14-33] 25 (09/21 0630) BP: (104-147)/(68-97) 123/69 (09/21 1000) SpO2:  [91 %-100 %] 95 % (09/21 1000)  Weight change:  Filed Weights   05/30/20 1149  Weight: 54.4 kg    Intake/Output: No intake/output data recorded.   Intake/Output this shift:  No intake/output data recorded.  Physical Exam: General: NAD,   Head: Normocephalic, atraumatic. Moist oral mucosal membranes  Eyes: Anicteric, PERRL  Neck: Supple, trachea midline  Lungs:  Clear to auscultation  Heart: Regular rate and rhythm  Abdomen:  Soft, nontender,   Extremities:  no peripheral edema.  Neurologic: Nonfocal, moving all four extremities  Skin: No lesions        Basic Metabolic Panel: Recent Labs  Lab 05/30/20 1156 05/30/20 1544 05/30/20 1700 05/30/20 1932 05/31/20 0137 05/31/20 0357 05/31/20 0848  NA 110*   < > 117* 118* 116* 115* 119*  K 2.9*  --   --   --   --  3.9  --   CL 72*  --   --   --   --  80*  --   CO2 25  --   --   --   --  23  --   GLUCOSE 143*  --   --   --   --  110*  --   BUN <5*  --   --   --   --  10  --   CREATININE 0.35*  --   --   --   --  0.62  --   CALCIUM 8.3*  --   --   --   --  8.5*  --    < > = values in this interval not displayed.    Liver Function Tests: Recent Labs  Lab 05/30/20 1156 05/31/20 0357  AST 86* 141*  ALT 51* 93*  ALKPHOS 88 85  BILITOT 0.7 0.8  PROT 7.1 7.0  ALBUMIN 3.7 3.4*   No results for input(s): LIPASE, AMYLASE in the last 168 hours. No results for input(s): AMMONIA in the last 168 hours.  CBC: Recent Labs  Lab 05/30/20 1156 05/31/20 0357  WBC 10.2 12.1*  NEUTROABS 8.6* 9.7*  HGB 12.8 13.0  HCT 34.5*  35.8*  MCV 77.9* 78.3*  PLT 255 295    Cardiac Enzymes: No results for input(s): CKTOTAL, CKMB, CKMBINDEX, TROPONINI in the last 168 hours.  BNP: Invalid input(s): POCBNP  CBG: Recent Labs  Lab 05/30/20 1228  GLUCAP 149*    Microbiology: Results for orders placed or performed during the hospital encounter of 05/30/20  SARS Coronavirus 2 by RT PCR (hospital order, performed in [facility] hospital lab) Nasopharyngeal Nasopharyngeal Swab     Status: Abnormal   Collection Time: 05/30/20  1:23 PM   Specimen: Nasopharyngeal Swab  Result Value Ref Range Status   SARS Coronavirus 2 POSITIVE (A) NEGATIVE Final    Comment: RESULT CALLED TO, READ BACK BY AND VERIFIED WITH: ANNA JASPER 05/30/20 AT 1450 BY ACR (NOTE) SARS-CoV-2 target nucleic acids are DETECTED  SARS-CoV-2 RNA is generally detectable in upper respiratory specimens  during the acute phase of infection.  Positive results  are indicative  of the presence of the identified virus, but do not rule out bacterial infection or co-infection with other pathogens not detected by the test.  Clinical correlation with patient history and  other diagnostic information is necessary to determine patient infection status.  The expected result is negative.  Fact Sheet for Patients:   BoilerBrush.com.cy   Fact Sheet for Healthcare Providers:   https://pope.com/    This test is not yet approved or cleared by the [place] FDA and  has been authorized for detection and/or diagnosis of SARS-CoV-2 by FDA under an Emergency Use Authorization (EUA).  This EUA will remain in effect (meaning this te st can be used) for the duration of  the COVID-19 declaration under Section 564(b)(1) of the Act, 21 U.S.C. section 360-bbb-3(b)(1), unless the authorization is terminated or revoked sooner.  Performed at [facility], [address] [place], [place] [address]     Coagulation  Studies: No results for input(s): LABPROT, INR in the last 72 hours.  Urinalysis: Recent Labs    05/30/20 1323  COLORURINE YELLOW*  LABSPEC 1.018  PHURINE 6.0  GLUCOSEU 50*  HGBUR SMALL*  BILIRUBINUR NEGATIVE  KETONESUR 20*  PROTEINUR >=300*  NITRITE NEGATIVE  LEUKOCYTESUR TRACE*      Imaging: DG Chest 2 View  Result Date: 05/30/2020 CLINICAL DATA:  Cough. EXAM: CHEST - 2 VIEW COMPARISON:  None. FINDINGS: The heart size and mediastinal contours are within normal limits. Both lungs are clear. The visualized skeletal structures are unremarkable. IMPRESSION: No active cardiopulmonary disease. Electronically Signed   By: Lupita Raider M.D.   On: 05/30/2020 12:50   DG Chest Port 1 View  Result Date: 05/31/2020 CLINICAL DATA:  Altered mental status EXAM: PORTABLE CHEST 1 VIEW COMPARISON:  05/30/2020 FINDINGS: The heart size and mediastinal contours are within normal limits. Both lungs are clear. The visualized skeletal structures are unremarkable. IMPRESSION: No active disease. Electronically Signed   By: Helyn Numbers MD   On: 05/31/2020 04:55     Medications:    sodium chloride     sodium chloride     azithromycin 500 mg (05/31/20 1055)   cefTRIAXone (ROCEPHIN)  IV Stopped (05/31/20 0950)   famotidine (PEPCID) IV     famotidine (PEPCID) IV Stopped (05/30/20 2241)    heparin  5,000 Units Subcutaneous Q8H   sodium chloride flush  3 mL Intravenous Q12H   sodium chloride, sodium chloride, acetaminophen, albuterol, diphenhydrAMINE, docusate sodium, EPINEPHrine, famotidine (PEPCID) IV, methylPREDNISolone (SOLU-MEDROL) injection, ondansetron (ZOFRAN) IV, polyethylene glycol, sodium chloride flush  Assessment/ Plan:  [name] is a 59 y.o. white female with no significant medical history , who was admitted to [facility] on 05/30/2020 for Covid+, dehydrated, EMS  1. Hyponatremia: consistent with hypovolemic hyponatremia Status post hypertonic saline infusion.   - q 4 hour sodium checks - if sodium does not correct, will start normal saline infusion   LOS: 1 Jeriah Skufca 9/21/202112:47 PM

## 2020-05-31 NOTE — ED Notes (Signed)
Pt declined a meal tray due to nausea. Pt declined multipe offers for antiemetics

## 2020-05-31 NOTE — Progress Notes (Signed)
Patient admitted with altered mental status found to be Covid positive with severe hyponatremia requiring 3%.  Nephrology is following. Mentation improved.  3% sodium chloride discontinued. [facility] to resume care from 06/01/2020.

## 2020-05-31 NOTE — Consult Note (Signed)
MEDICATION RELATED CONSULT NOTE  Pharmacy Consult for 3% NaCl Monitoring Indication: Severe hyponatremia  Patient Measurements: Height: 5' (152.4 cm) Weight: 54.4 kg (120 lb) IBW/kg (Calculated) : 45.5  Vital Signs: Temp: 99 F (37.2 C) (09/21 0100) BP: 123/69 (09/21 1000) Pulse Rate: 88 (09/21 1000)  Estimated Creatinine Clearance: 54.4 mL/min (by C-G formula based on SCr of 0.62 mg/dL).   Assessment: Patient is a 59 y/o F who presented to the ED 9/20 with altered mental status. Labs significant for Na 110, Cl 92, K 2.9. Patient was started on hypertonic saline for symptomatic hyponatremia and later discontinued due to rate of Na correction. Now restarting hypertonic saline.  Hypertonic Saline Monitoring:  0920: Na @ 1200 = 110 0920: Na @ 1545 = 115 - 3% NaCl started at 84ml/hr @1606  0920: Na @ 1700 = 117 - 3% NaCl stopped @1709  0920: Na @ 1932 = 118 0921: Na @ 0137 = 116 0921: NA @ 0357 = 115 0921: Na @ 0848= 119 (before 3% saline restarted per RN) - restarted at 8ml/hr @ ~0900   Goal of Therapy:  Increase Na by 4-6 mmol/L in 1st 6 hours of therapy Max correction of 8 mmol/L in any 24h period  Plan:  -Na 115 > 119 before the 3% saline was restarted. Discussed with Nephrology who has discontinued 3% saline for now but would still like to follow serial Na checks and MD said if need be can start normal saline --Na checks q4h   PharmD Clinical Pharmacist 05/31/2020

## 2020-05-31 NOTE — ED Notes (Addendum)
Amy with pharmacy notified by phone of pts NA level 119. This is an increase from 115 at 0400

## 2020-05-31 NOTE — ED Notes (Signed)
Pt up to restroom and sink to wash hands and brush her teeth. Alert and calm at this time. States she is feeling a little better. Assisted pt with ADL's and helped her back on stretcher. provided for comfort and safety.

## 2020-06-01 ENCOUNTER — Inpatient Hospital Stay: Payer: BC Managed Care – PPO

## 2020-06-01 DIAGNOSIS — E871 Hypo-osmolality and hyponatremia: Principal | ICD-10-CM

## 2020-06-01 LAB — BASIC METABOLIC PANEL
Anion gap: 11 (ref 5–15)
BUN: 8 mg/dL (ref 6–20)
CO2: 25 mmol/L (ref 22–32)
Calcium: 8.4 mg/dL — ABNORMAL LOW (ref 8.9–10.3)
Chloride: 91 mmol/L — ABNORMAL LOW (ref 98–111)
Creatinine, Ser: 0.52 mg/dL (ref 0.44–1.00)
GFR calc Af Amer: >60 mL/min (ref >60)
GFR calc non Af Amer: >60 mL/min (ref >60)
Glucose, Bld: 100 mg/dL — ABNORMAL HIGH (ref 70–99)
Potassium: 3.3 mmol/L — ABNORMAL LOW (ref 3.5–5.1)
Sodium: 127 mmol/L — ABNORMAL LOW (ref 135–145)

## 2020-06-01 LAB — CBC WITH DIFFERENTIAL/PLATELET
Abs Immature Granulocytes: 0.09 10*3/uL — ABNORMAL HIGH (ref 0.00–0.07)
Basophils Absolute: 0 10*3/uL (ref 0.0–0.1)
Basophils Relative: 0 %
Eosinophils Absolute: 0 10*3/uL (ref 0.0–0.5)
Eosinophils Relative: 0 %
HCT: 36.5 % (ref 36.0–46.0)
Hemoglobin: 13 g/dL (ref 12.0–15.0)
Immature Granulocytes: 1 %
Lymphocytes Relative: 15 %
Lymphs Abs: 1.4 10*3/uL (ref 0.7–4.0)
MCH: 28.5 pg (ref 26.0–34.0)
MCHC: 35.6 g/dL (ref 30.0–36.0)
MCV: 80 fL (ref 80.0–100.0)
Monocytes Absolute: 0.4 10*3/uL (ref 0.1–1.0)
Monocytes Relative: 5 %
Neutro Abs: 7.4 10*3/uL (ref 1.7–7.7)
Neutrophils Relative %: 79 %
Platelets: 311 10*3/uL (ref 150–400)
RBC: 4.56 MIL/uL (ref 3.87–5.11)
RDW: 12.3 % (ref 11.5–15.5)
WBC: 9.3 10*3/uL (ref 4.0–10.5)
nRBC: 0 % (ref 0.0–0.2)

## 2020-06-01 LAB — COMPREHENSIVE METABOLIC PANEL
ALT: 87 U/L — ABNORMAL HIGH (ref 0–44)
AST: 113 U/L — ABNORMAL HIGH (ref 15–41)
Albumin: 3.3 g/dL — ABNORMAL LOW (ref 3.5–5.0)
Alkaline Phosphatase: 85 U/L (ref 38–126)
Anion gap: 13 (ref 5–15)
BUN: 10 mg/dL (ref 6–20)
CO2: 24 mmol/L (ref 22–32)
Calcium: 8.6 mg/dL — ABNORMAL LOW (ref 8.9–10.3)
Chloride: 86 mmol/L — ABNORMAL LOW (ref 98–111)
Creatinine, Ser: 0.64 mg/dL (ref 0.44–1.00)
GFR calc Af Amer: >60 mL/min (ref >60)
GFR calc non Af Amer: >60 mL/min (ref >60)
Glucose, Bld: 97 mg/dL (ref 70–99)
Potassium: 3.1 mmol/L — ABNORMAL LOW (ref 3.5–5.1)
Sodium: 123 mmol/L — ABNORMAL LOW (ref 135–145)
Total Bilirubin: 0.8 mg/dL (ref 0.3–1.2)
Total Protein: 6.9 g/dL (ref 6.5–8.1)

## 2020-06-01 LAB — GLUCOSE, CAPILLARY: Glucose-Capillary: 106 mg/dL — ABNORMAL HIGH (ref 70–99)

## 2020-06-01 LAB — THYROID PANEL WITH TSH
Free Thyroxine Index: 1.9 (ref 1.2–4.9)
T3 Uptake Ratio: 23 % — ABNORMAL LOW (ref 24–39)
T4, Total: 8.2 ug/dL (ref 4.5–12.0)
TSH: 0.535 u[IU]/mL (ref 0.450–4.500)

## 2020-06-01 LAB — SODIUM
Sodium: 125 mmol/L — ABNORMAL LOW (ref 135–145)
Sodium: 127 mmol/L — ABNORMAL LOW (ref 135–145)

## 2020-06-01 LAB — FIBRIN DERIVATIVES D-DIMER (ARMC ONLY): Fibrin derivatives D-dimer (ARMC): 1540.44 ng/mL (FEU) — ABNORMAL HIGH (ref 0.00–499.00)

## 2020-06-01 LAB — PROCALCITONIN: Procalcitonin: 0.17 ng/mL

## 2020-06-01 LAB — FERRITIN: Ferritin: 1140 ng/mL — ABNORMAL HIGH (ref 11–307)

## 2020-06-01 MED ORDER — POTASSIUM CHLORIDE IN NACL 20-0.9 MEQ/L-% IV SOLN
INTRAVENOUS | Status: DC
  Filled 2020-06-01 (×7): qty 1000

## 2020-06-01 NOTE — ED Notes (Signed)
Pt assisted to bedside toilet. Unsteady gait.

## 2020-06-01 NOTE — Progress Notes (Signed)
PROGRESS NOTE    Karina Swanson  ZOX:[id number] DOB: 05/01/1961 DOA: 05/30/2020 PCP: Johny BlamerHarris, William, MD   Brief Narrative:  59 y.o. Female admitted with Acute Metabolic Encephalopathy in the setting of Hypotonic Hypovolemic Hyponatremia requiring Hypertonic Saline.  Also found to have COVID-19, but pt without hypoxia or respiratory symptoms, and has normal CXR. Nephrology consulted and managing pt's severe hyponatremia with mental status changes. Level has been steadily Improving.  Per notes, she has been having about a 2 week history of fever/chills, cough.  She has a COVID test performed last week on 05/26/2020 by her PCP, but results are not available.  She was reported to be lethargic and confused with poor PO intake since last night, therefore her family brought her to ED.  Upon presentation to the ED, her vital signs are within normal limits (RR 16, HR 85, BP 119/69, 100% SpO2 on room air), and no signs of respiratory symptoms.  Initial workup revealed serum Na 110, potassium 2.9, BUN <5, Creatinine < 0.35, glucose 143, AST 86, ALT 51, WBC 10.2 with Neutrophilia, procalcitonin 0.18, serum osmolality 237, urine osmolality 457, and Urine Na 19.  Her COVID-19 PCR is positive.  Her CXR is normal.  Urinalysis with trace leukocytes.  Nephrology was consulted who recommended placing on 3% Hypertonic Saline infusion at 20 cc/hr.  PCCM is asked to admit the pt to ICU for further workup and treatment of Acute Metabolic Encephalopathy in the setting of Hypotonic Hypovolemic Hyponatremia requiring 3% Hypertonic Saline.   Blood pressure 117/75, pulse 74, temperature 98.6 F (37 C), temperature source Oral, resp. rate (!) 21, height 5' (1.524 m), weight 54.4 kg, SpO2 97 %.   Physical Exam Vitals reviewed.  Constitutional:      Appearance: Normal appearance. She is normal weight.  HENT:     Head: Normocephalic and atraumatic.     Right Ear: External ear normal.     Left Ear: External ear  normal.  Eyes:     Extraocular Movements: Extraocular movements intact.  Cardiovascular:     Rate and Rhythm: Normal rate and regular rhythm.     Pulses: Normal pulses.     Heart sounds: Normal heart sounds.  Pulmonary:     Effort: Pulmonary effort is normal.     Breath sounds: Normal breath sounds.  Abdominal:     Palpations: Abdomen is soft.  Skin:    General: Skin is warm.  Neurological:     General: No focal deficit present.     Mental Status: She is alert and oriented to person, place, and time. Mental status is at baseline.  Psychiatric:        Mood and Affect: Mood normal.     Assessment & Plan:   Active Problems:   Hyponatremia continue ivf at 50 cc hour. Serum sodium check. Pt is stable neurologically and anticipate d/c tomorrow to home PT consult. Will d/c in am if stable.   DVT prophylaxis: lovenox. Code Status: full code Family Communication: Johnny  Disposition Plan: Home  Status is: Inpatient Dispo: The patient is from: Home              Anticipated d/c is to: Home              Anticipated d/c date is:2              Patient currently is not medically stable to d/c.  Consultants:  Nephrology: Dr. Wynelle LinkKolluru.   Subjective: Pt is alert and awake and is  stable but weak from her Gastroenteritis attributed to covid-19 infection. PT has been asymptomatic and came for hyponatremia and was initially treated with hypertonic saline which has been d./c and pt is on ns.   Objective: Vitals:   06/01/20 1400 06/01/20 1430 06/01/20 1500 06/01/20 1730  BP: 102/71 109/68 108/65 93/64  Pulse: 74 60 73 74  Resp: 20 (!) 23 19 17   Temp:    98 F (36.7 C)  TempSrc:    Oral  SpO2: 94% 97% 96% 94%  Weight:    54.4 kg  Height:    5' (1.524 m)    Intake/Output Summary (Last 24 hours) at 06/01/2020 1741 Last data filed at 06/01/2020 0844 Gross per 24 hour  Intake 350 ml  Output --  Net 350 ml   Filed Weights   05/30/20 1149 06/01/20 1730  Weight: 54.4 kg 54.4  kg    Examination: Blood pressure 109/65, pulse 88, temperature 98.3 F (36.8 C), temperature source Oral, resp. rate 20, height 5' (1.524 m), weight 54.4 kg, SpO2 93 %.   General exam: Appears calm and comfortable  Respiratory system: Clear to auscultation. Respiratory effort normal. Cardiovascular system: S1 & S2 heard, RRR. No JVD, murmurs, rubs, gallops or clicks. No pedal edema. Gastrointestinal system: Abdomen is nondistended, soft and nontender. No organomegaly or masses felt. Normal bowel sounds heard. Central nervous system: Alert and oriented. No focal neurological deficits. Extremities: Symmetric 5 x 5 power. Skin: No rashes, lesions or ulcers Psychiatry: Judgement and insight appear normal. Mood & affect appropriate.     Data Reviewed: I have personally reviewed following labs and imaging studies  No intake/output data recorded. Total I/O In: 350 [IV Piggyback:350] Out: -  Lab Results  Component Value Date   CREATININE 0.64 06/01/2020   CREATININE 0.62 05/31/2020   CREATININE 0.35 (L) 05/30/2020   CBC: Recent Labs  Lab 05/30/20 1156 05/31/20 0357 06/01/20 0625  WBC 10.2 12.1* 9.3  NEUTROABS 8.6* 9.7* 7.4  HGB 12.8 13.0 13.0  HCT 34.5* 35.8* 36.5  MCV 77.9* 78.3* 80.0  PLT 255 295 311   Basic Metabolic Panel: Recent Labs  Lab 05/30/20 1156 05/30/20 1544 05/31/20 0357 05/31/20 0848 05/31/20 1325 05/31/20 1814 06/01/20 0625 06/01/20 0901 06/01/20 1404  NA 110*   < > 115*   < > 121* 124* 123* 125* 127*  K 2.9*  --  3.9  --   --   --  3.1*  --   --   CL 72*  --  80*  --   --   --  86*  --   --   CO2 25  --  23  --   --   --  24  --   --   GLUCOSE 143*  --  110*  --   --   --  97  --   --   BUN <5*  --  10  --   --   --  10  --   --   CREATININE 0.35*  --  0.62  --   --   --  0.64  --   --   CALCIUM 8.3*  --  8.5*  --   --   --  8.6*  --   --    < > = values in this interval not displayed.   GFR: Estimated Creatinine Clearance: 54.4 mL/min (by  C-G formula based on SCr of 0.64 mg/dL). Liver Function Tests: Recent Labs  Lab  05/30/20 1156 05/31/20 0357 06/01/20 0625  AST 86* 141* 113*  ALT 51* 93* 87*  ALKPHOS 88 85 85  BILITOT 0.7 0.8 0.8  PROT 7.1 7.0 6.9  ALBUMIN 3.7 3.4* 3.3*   No results for input(s): LIPASE, AMYLASE in the last 168 hours. No results for input(s): AMMONIA in the last 168 hours. Coagulation Profile: No results for input(s): INR, PROTIME in the last 168 hours. Cardiac Enzymes: No results for input(s): CKTOTAL, CKMB, CKMBINDEX, TROPONINI in the last 168 hours. BNP (last 3 results) No results for input(s): PROBNP in the last 8760 hours. HbA1C: No results for input(s): HGBA1C in the last 72 hours. CBG: Recent Labs  Lab 05/30/20 1228  GLUCAP 149*   Lipid Profile: Recent Labs    05/30/20 1700  CHOL 147  HDL 60  LDLCALC 69  TRIG 90  CHOLHDL 2.5   Thyroid Function Tests: Recent Labs    05/30/20 1700  TSH 0.535  T4TOTAL 8.2   Anemia Panel: Recent Labs    05/31/20 0357 06/01/20 0625  FERRITIN 1,497* 1,140*   Sepsis Labs: Recent Labs  Lab 05/30/20 2100 05/31/20 0357 06/01/20 0625  PROCALCITON 0.18 0.33 0.17    Recent Results (from the past 240 hour(s))  Culture, Urine     Status: Abnormal   Collection Time: 05/30/20 11:58 AM   Specimen: Urine, Random  Result Value Ref Range Status   Specimen Description   Final    URINE, RANDOM Performed at [facility], [address], [place], [place] [address]    Special Requests   Final    NONE Performed at [facility], [address] [place], [place] [address]    Culture MULTIPLE SPECIES PRESENT, SUGGEST RECOLLECTION (A)  Final   Report Status 05/31/2020 FINAL  Final  SARS Coronavirus 2 by RT PCR (hospital order, performed in [facility] hospital lab) Nasopharyngeal Nasopharyngeal Swab     Status: Abnormal   Collection Time: 05/30/20  1:23 PM   Specimen: Nasopharyngeal Swab  Result Value Ref Range Status    SARS Coronavirus 2 POSITIVE (A) NEGATIVE Final    Comment: RESULT CALLED TO, READ BACK BY AND VERIFIED WITH: ANNA JASPER 05/30/20 AT 1450 BY ACR (NOTE) SARS-CoV-2 target nucleic acids are DETECTED  SARS-CoV-2 RNA is generally detectable in upper respiratory specimens  during the acute phase of infection.  Positive results are indicative  of the presence of the identified virus, but do not rule out bacterial infection or co-infection with other pathogens not detected by the test.  Clinical correlation with patient history and  other diagnostic information is necessary to determine patient infection status.  The expected result is negative.  Fact Sheet for Patients:   BoilerBrush.com.cy   Fact Sheet for Healthcare Providers:   https://pope.com/    This test is not yet approved or cleared by the [place] FDA and  has been authorized for detection and/or diagnosis of SARS-CoV-2 by FDA under an Emergency Use Authorization (EUA).  This EUA will remain in effect (meaning this te st can be used) for the duration of  the COVID-19 declaration under Section 564(b)(1) of the Act, 21 U.S.C. section 360-bbb-3(b)(1), unless the authorization is terminated or revoked sooner.  Performed at [facility], [address] [place], [place] [address]          Radiology Studies: [facility] Chest [place] 1 View  Result Date: 05/31/2020 CLINICAL DATA:  Altered mental status EXAM: PORTABLE CHEST 1 VIEW COMPARISON:  05/30/2020 FINDINGS: The  heart size and mediastinal contours are within normal limits. Both lungs are clear. The visualized skeletal structures are unremarkable. IMPRESSION: No active disease. Electronically Signed   By: Helyn Numbers MD   On: 05/31/2020 04:55        Scheduled Meds: . vitamin C  1,000 mg Oral TID  . cholecalciferol  400 Units Oral Daily  . dextromethorphan-guaiFENesin  1 tablet Oral BID  . heparin  5,000 Units  Subcutaneous Q8H  . melatonin  5 mg Oral QHS  . sodium chloride flush  3 mL Intravenous Q12H  . zinc sulfate  220 mg Oral Daily   Continuous Infusions: . sodium chloride    . sodium chloride    . 0.9 % NaCl with KCl 20 mEq / L 50 mL/hr at 06/01/20 1002  . azithromycin Stopped (06/01/20 0844)  . cefTRIAXone (ROCEPHIN)  IV Stopped (06/01/20 0843)  . famotidine (PEPCID) IV    . famotidine (PEPCID) IV Stopped (05/30/20 2241)     LOS: 2 days    Gertha Calkin, MD [facility] Pager [phone] If 7PM-7AM, please contact night-coverage [email or web address] Password [facility] 06/01/2020, 5:41 PM

## 2020-06-01 NOTE — Progress Notes (Signed)
Pt is a new admit from ED to room 233, admitted for AMS, and covid positive. Pt currently alert and oriented X 4, denies pain, denies home meds, or any medical history. Pt oriented to the room, call bed, and personal belonging within reach. Staff will continue to monitor pt.

## 2020-06-01 NOTE — ED Notes (Signed)
Pt provided water as requested 

## 2020-06-01 NOTE — ED Notes (Signed)
Son updated to the best of this RN ability with pt verbal permission

## 2020-06-01 NOTE — ED Notes (Signed)
Pt eating lunch dropped off by family at this time

## 2020-06-01 NOTE — ED Notes (Signed)
Pt calling out and states she needs to have a bowel movement. Pt states she is unable to do so in bedpan or bedside commode. Pt asking if their is a restroom available to use. Pt taken via wheelchair to restroom in hallway with mask in place. Restroom is to be closed for allotted time after use for appropriate cleaning due to + COVID status. Pt tolerated well. Provided with warm disposable wipes to clean herself with and disposable underpants and pants to wear since hers were soiled. Pt unsteady while transferring to wheelchair and attempting to ambulate. Instructed to not get up without assistance for her safety. Verbalized understanding.

## 2020-06-01 NOTE — ED Notes (Signed)
Pt asked if she wants to take her prescribed medications at this time to help her feel better more quickly. Pt refused and states she doesn't feel that she needs them and will take them as need when she is at home. Pt educated about benefits of taking medications due to her illness but she adamantly refuses.

## 2020-06-01 NOTE — Progress Notes (Signed)
[facility]  ROUNDING NOTE   Subjective:   More alert and oriented this morning. Able to have a conversation and recognizes when she is confused.   IV fluids held overnight.   Na 125  Objective:  Vital signs in last 24 hours:  Temp:  [98.7 F (37.1 C)-99.6 F (37.6 C)] 99.6 F (37.6 C) (09/22 0630) Pulse Rate:  [72-101] 79 (09/22 1030) Resp:  [17-29] 19 (09/22 1030) BP: (93-126)/(58-76) 111/58 (09/22 1030) SpO2:  [90 %-97 %] 91 % (09/22 1030)  Weight change:  Filed Weights   05/30/20 1149  Weight: 54.4 kg    Intake/Output: No intake/output data recorded.   Intake/Output this shift:  Total I/O In: 350 [IV Piggyback:350] Out: -   Physical Exam: General: NAD, laying in bed  Head: Normocephalic, atraumatic. Moist oral mucosal membranes  Eyes: Anicteric, PERRL  Neck: Supple, trachea midline  Lungs:  Clear to auscultation  Heart: Regular rate and rhythm  Abdomen:  Soft, nontender  Extremities:  no peripheral edema.  Neurologic: Nonfocal, moving all four extremities  Skin: No lesions        Basic Metabolic Panel: Recent Labs  Lab 05/30/20 1156 05/30/20 1544 05/31/20 0357 05/31/20 0357 05/31/20 0848 05/31/20 1325 05/31/20 1814 06/01/20 0625 06/01/20 0901  NA 110*   < > 115*   < > 119* 121* 124* 123* 125*  K 2.9*  --  3.9  --   --   --   --  3.1*  --   CL 72*  --  80*  --   --   --   --  86*  --   CO2 25  --  23  --   --   --   --  24  --   GLUCOSE 143*  --  110*  --   --   --   --  97  --   BUN <5*  --  10  --   --   --   --  10  --   CREATININE 0.35*  --  0.62  --   --   --   --  0.64  --   CALCIUM 8.3*  --  8.5*  --   --   --   --  8.6*  --    < > = values in this interval not displayed.    Liver Function Tests: Recent Labs  Lab 05/30/20 1156 05/31/20 0357 06/01/20 0625  AST 86* 141* 113*  ALT 51* 93* 87*  ALKPHOS 88 85 85  BILITOT 0.7 0.8 0.8  PROT 7.1 7.0 6.9  ALBUMIN 3.7 3.4* 3.3*   No results for input(s): LIPASE,  AMYLASE in the last 168 hours. No results for input(s): AMMONIA in the last 168 hours.  CBC: Recent Labs  Lab 05/30/20 1156 05/31/20 0357 06/01/20 0625  WBC 10.2 12.1* 9.3  NEUTROABS 8.6* 9.7* 7.4  HGB 12.8 13.0 13.0  HCT 34.5* 35.8* 36.5  MCV 77.9* 78.3* 80.0  PLT 255 295 311    Cardiac Enzymes: No results for input(s): CKTOTAL, CKMB, CKMBINDEX, TROPONINI in the last 168 hours.  BNP: Invalid input(s): POCBNP  CBG: Recent Labs  Lab 05/30/20 1228  GLUCAP 149*    Microbiology: Results for orders placed or performed during the hospital encounter of 05/30/20  Culture, Urine     Status: Abnormal   Collection Time: 05/30/20 11:58 AM   Specimen: Urine, Random  Result Value Ref Range Status   Specimen Description  Final    URINE, RANDOM Performed at [facility], [address] [place], [place] [address]    Special Requests   Final    NONE Performed at [facility], [address] [place], [place] [address]    Culture MULTIPLE SPECIES PRESENT, SUGGEST RECOLLECTION (A)  Final   Report Status 05/31/2020 FINAL  Final  SARS Coronavirus 2 by RT PCR (hospital order, performed in [facility] hospital lab) Nasopharyngeal Nasopharyngeal Swab     Status: Abnormal   Collection Time: 05/30/20  1:23 PM   Specimen: Nasopharyngeal Swab  Result Value Ref Range Status   SARS Coronavirus 2 POSITIVE (A) NEGATIVE Final    Comment: RESULT CALLED TO, READ BACK BY AND VERIFIED WITH: ANNA JASPER 05/30/20 AT 1450 BY ACR (NOTE) SARS-CoV-2 target nucleic acids are DETECTED  SARS-CoV-2 RNA is generally detectable in upper respiratory specimens  during the acute phase of infection.  Positive results are indicative  of the presence of the identified virus, but do not rule out bacterial infection or co-infection with other pathogens not detected by the test.  Clinical correlation with patient history and  other diagnostic information is necessary to determine  patient infection status.  The expected result is negative.  Fact Sheet for Patients:   BoilerBrush.com.cy   Fact Sheet for Healthcare Providers:   https://pope.com/    This test is not yet approved or cleared by the [place] FDA and  has been authorized for detection and/or diagnosis of SARS-CoV-2 by FDA under an Emergency Use Authorization (EUA).  This EUA will remain in effect (meaning this te st can be used) for the duration of  the COVID-19 declaration under Section 564(b)(1) of the Act, 21 U.S.C. section 360-bbb-3(b)(1), unless the authorization is terminated or revoked sooner.  Performed at [facility], [address] [place], [place] [address]     Coagulation Studies: No results for input(s): LABPROT, INR in the last 72 hours.  Urinalysis: Recent Labs    05/30/20 1323  COLORURINE YELLOW*  LABSPEC 1.018  PHURINE 6.0  GLUCOSEU 50*  HGBUR SMALL*  BILIRUBINUR NEGATIVE  KETONESUR 20*  PROTEINUR >=300*  NITRITE NEGATIVE  LEUKOCYTESUR TRACE*      Imaging: DG Chest 2 View  Result Date: 05/30/2020 CLINICAL DATA:  Cough. EXAM: CHEST - 2 VIEW COMPARISON:  None. FINDINGS: The heart size and mediastinal contours are within normal limits. Both lungs are clear. The visualized skeletal structures are unremarkable. IMPRESSION: No active cardiopulmonary disease. Electronically Signed   By: Lupita Raider M.D.   On: 05/30/2020 12:50   DG Chest Port 1 View  Result Date: 05/31/2020 CLINICAL DATA:  Altered mental status EXAM: PORTABLE CHEST 1 VIEW COMPARISON:  05/30/2020 FINDINGS: The heart size and mediastinal contours are within normal limits. Both lungs are clear. The visualized skeletal structures are unremarkable. IMPRESSION: No active disease. Electronically Signed   By: Helyn Numbers MD   On: 05/31/2020 04:55     Medications:   . sodium chloride    . sodium chloride    . 0.9 % NaCl with KCl 20 mEq / L  50 mL/hr at 06/01/20 1002  . azithromycin Stopped (06/01/20 0844)  . cefTRIAXone (ROCEPHIN)  IV Stopped (06/01/20 0843)  . famotidine (PEPCID) IV    . famotidine (PEPCID) IV Stopped (05/30/20 2241)   . vitamin C  1,000 mg Oral TID  . cholecalciferol  400 Units Oral Daily  . dextromethorphan-guaiFENesin  1 tablet Oral BID  . heparin  5,000 Units Subcutaneous Q8H  . melatonin  5 mg Oral QHS  . sodium chloride flush  3 mL Intravenous Q12H  . zinc sulfate  220 mg Oral Daily   sodium chloride, sodium chloride, acetaminophen, acetaminophen, albuterol, diphenhydrAMINE, docusate sodium, EPINEPHrine, famotidine (PEPCID) IV, ibuprofen, methylPREDNISolone (SOLU-MEDROL) injection, ondansetron (ZOFRAN) IV, polyethylene glycol, sodium chloride flush  Assessment/ Plan:  [name] is a 59 y.o. white female with no significant medical history , who was admitted to [facility] on 05/30/2020 for Covid+, dehydrated, EMS  1. Hyponatremia: consistent with hypovolemic hyponatremia Status post hypertonic saline infusion.  - q 6 hour sodium checks - gentle IV fluids: normal saline at 36mL/hr  2. Hypokalemia: secondary to saline administration - add potassium chloride to maintenance fluids   LOS: 2 Karina Swanson 9/22/202111:34 AM

## 2020-06-01 NOTE — ED Notes (Signed)
Pt refusing all medications and states she can take them when she gets home. MD aware.

## 2020-06-01 NOTE — ED Notes (Addendum)
Pt resting at this time, RR even and unlabored. Alert and oriented.  Denies needing to use the restroom at this time

## 2020-06-02 ENCOUNTER — Inpatient Hospital Stay: Payer: BC Managed Care – PPO

## 2020-06-02 ENCOUNTER — Encounter: Payer: Self-pay | Admitting: Internal Medicine

## 2020-06-02 DIAGNOSIS — U071 COVID-19: Secondary | ICD-10-CM

## 2020-06-02 DIAGNOSIS — J9601 Acute respiratory failure with hypoxia: Secondary | ICD-10-CM | POA: Diagnosis not present

## 2020-06-02 LAB — COMPREHENSIVE METABOLIC PANEL
ALT: 65 U/L — ABNORMAL HIGH (ref 0–44)
AST: 65 U/L — ABNORMAL HIGH (ref 15–41)
Albumin: 2.9 g/dL — ABNORMAL LOW (ref 3.5–5.0)
Alkaline Phosphatase: 74 U/L (ref 38–126)
Anion gap: 10 (ref 5–15)
BUN: 7 mg/dL (ref 6–20)
CO2: 26 mmol/L (ref 22–32)
Calcium: 8.1 mg/dL — ABNORMAL LOW (ref 8.9–10.3)
Chloride: 95 mmol/L — ABNORMAL LOW (ref 98–111)
Creatinine, Ser: 0.51 mg/dL (ref 0.44–1.00)
GFR calc Af Amer: >60 mL/min (ref >60)
GFR calc non Af Amer: >60 mL/min (ref >60)
Glucose, Bld: 90 mg/dL (ref 70–99)
Potassium: 3.3 mmol/L — ABNORMAL LOW (ref 3.5–5.1)
Sodium: 131 mmol/L — ABNORMAL LOW (ref 135–145)
Total Bilirubin: 0.8 mg/dL (ref 0.3–1.2)
Total Protein: 6.2 g/dL — ABNORMAL LOW (ref 6.5–8.1)

## 2020-06-02 LAB — SODIUM
Sodium: 128 mmol/L — ABNORMAL LOW (ref 135–145)
Sodium: 128 mmol/L — ABNORMAL LOW (ref 135–145)
Sodium: 127 mmol/L — ABNORMAL LOW (ref 135–145)

## 2020-06-02 LAB — C-REACTIVE PROTEIN
CRP: 6.7 mg/dL — ABNORMAL HIGH (ref <1.0)
CRP: 5.4 mg/dL — ABNORMAL HIGH (ref <1.0)

## 2020-06-02 LAB — CBC WITH DIFFERENTIAL/PLATELET
Abs Immature Granulocytes: 0.19 10*3/uL — ABNORMAL HIGH (ref 0.00–0.07)
Basophils Absolute: 0 10*3/uL (ref 0.0–0.1)
Basophils Relative: 0 %
Eosinophils Absolute: 0 10*3/uL (ref 0.0–0.5)
Eosinophils Relative: 0 %
HCT: 34.3 % — ABNORMAL LOW (ref 36.0–46.0)
Hemoglobin: 11.9 g/dL — ABNORMAL LOW (ref 12.0–15.0)
Immature Granulocytes: 2 %
Lymphocytes Relative: 14 %
Lymphs Abs: 1.5 10*3/uL (ref 0.7–4.0)
MCH: 28.3 pg (ref 26.0–34.0)
MCHC: 34.7 g/dL (ref 30.0–36.0)
MCV: 81.7 fL (ref 80.0–100.0)
Monocytes Absolute: 0.5 10*3/uL (ref 0.1–1.0)
Monocytes Relative: 5 %
Neutro Abs: 8.1 10*3/uL — ABNORMAL HIGH (ref 1.7–7.7)
Neutrophils Relative %: 79 %
Platelets: 323 10*3/uL (ref 150–400)
RBC: 4.2 MIL/uL (ref 3.87–5.11)
RDW: 12.6 % (ref 11.5–15.5)
WBC: 10.3 10*3/uL (ref 4.0–10.5)
nRBC: 0 % (ref 0.0–0.2)

## 2020-06-02 LAB — FERRITIN: Ferritin: 1036 ng/mL — ABNORMAL HIGH (ref 11–307)

## 2020-06-02 LAB — PHOSPHORUS: Phosphorus: 2.1 mg/dL — ABNORMAL LOW (ref 2.5–4.6)

## 2020-06-02 LAB — PROCALCITONIN: Procalcitonin: <0.1 ng/mL

## 2020-06-02 LAB — FIBRIN DERIVATIVES D-DIMER (ARMC ONLY): Fibrin derivatives D-dimer (ARMC): 2990.37 ng/mL (FEU) — ABNORMAL HIGH (ref 0.00–499.00)

## 2020-06-02 LAB — MAGNESIUM: Magnesium: 1.9 mg/dL (ref 1.7–2.4)

## 2020-06-02 MED ORDER — SODIUM CHLORIDE 0.9 % IV SOLN
200.0000 mg | Freq: Once | INTRAVENOUS | Status: AC
  Administered 2020-06-02: 200 mg via INTRAVENOUS
  Filled 2020-06-02: qty 200

## 2020-06-02 MED ORDER — POTASSIUM PHOSPHATES 15 MMOLE/5ML IV SOLN
20.0000 mmol | Freq: Once | INTRAVENOUS | Status: AC
  Administered 2020-06-02: 20 mmol via INTRAVENOUS
  Filled 2020-06-02: qty 6.67

## 2020-06-02 MED ORDER — METHYLPREDNISOLONE SODIUM SUCC 40 MG IJ SOLR
40.0000 mg | Freq: Two times a day (BID) | INTRAMUSCULAR | Status: DC
  Administered 2020-06-02 – 2020-06-06 (×9): 40 mg via INTRAVENOUS
  Filled 2020-06-02 (×9): qty 1

## 2020-06-02 MED ORDER — SODIUM CHLORIDE 0.9 % IV SOLN
100.0000 mg | Freq: Every day | INTRAVENOUS | Status: AC
  Administered 2020-06-03 – 2020-06-06 (×4): 100 mg via INTRAVENOUS
  Filled 2020-06-02: qty 20
  Filled 2020-06-02: qty 100
  Filled 2020-06-02 (×3): qty 20

## 2020-06-02 MED ORDER — IOHEXOL 350 MG/ML SOLN
75.0000 mL | Freq: Once | INTRAVENOUS | Status: AC | PRN
  Administered 2020-06-02: 75 mL via INTRAVENOUS

## 2020-06-02 MED ORDER — ENOXAPARIN SODIUM 40 MG/0.4ML ~~LOC~~ SOLN
40.0000 mg | SUBCUTANEOUS | Status: DC
  Administered 2020-06-02 – 2020-06-05 (×4): 40 mg via SUBCUTANEOUS
  Filled 2020-06-02 (×4): qty 0.4

## 2020-06-02 NOTE — Plan of Care (Signed)

## 2020-06-02 NOTE — Progress Notes (Signed)
Patient remains alert  And oriented, denies any pain, vss remaisn on o2 2 L [place] sat 93-94% assisted to [facility] as needed, fall precaution in place, bed alarm activated patient educated to call for help , presently off the floor to CT ,

## 2020-06-02 NOTE — Progress Notes (Signed)
[facility]  ROUNDING NOTE   Subjective:   Patient resting in bed, awake, alert, requesting to get discharged home.Na+ 128 today. Patient still has generalized fatigue per nursing report.She denies nausea, vomiting, but reports anorexia and poor oral intake.  Objective:  Vital signs in last 24 hours:  Temp:  [97.9 F (36.6 C)-98.9 F (37.2 C)] 97.9 F (36.6 C) (09/23 1110) Pulse Rate:  [60-84] 84 (09/23 1110) Resp:  [17-29] 22 (09/23 1110) BP: (93-120)/(64-84) 120/84 (09/23 1110) SpO2:  [92 %-97 %] 93 % (09/23 1110) Weight:  [54.4 kg] 54.4 kg (09/23 0416)  Weight change:  Filed Weights   05/30/20 1149 06/01/20 1730 06/02/20 0416  Weight: 54.4 kg 54.4 kg 54.4 kg    Intake/Output: I/O last 3 completed shifts: In: 1246.1 [P.O.:60; I.V.:836.1; IV Piggyback:350] Out: 0    Intake/Output this shift:  Total I/O In: -  Out: 50 [Urine:50]  Physical Exam: General: No acute distress, appears slightly lethargic  Head: Normocephalic, atraumatic. Moist oral mucosal membranes  Eyes: Anicteric  Neck: Supple, trachea midline  Lungs:  Normal and symmetrical respiratory effort,Lungs clear  Heart: S1S2 +Regular rate and rhythm  Abdomen:  Soft, nontender  Extremities:  no peripheral edema.  Neurologic: Alert, awake, answers questions appropriately  Skin: No acute rashes or lesions        Basic Metabolic Panel: Recent Labs  Lab 05/30/20 1156 05/30/20 1544 05/31/20 0357 05/31/20 0848 06/01/20 0625 06/01/20 0625 06/01/20 0901 06/01/20 1404 06/01/20 2300 06/02/20 0343 06/02/20 0843  NA 110*   < > 115*   < > 123*   < > 125* 127* 127* 131* 128*  K 2.9*  --  3.9  --  3.1*  --   --   --  3.3* 3.3*  --   CL 72*  --  80*  --  86*  --   --   --  91* 95*  --   CO2 25  --  23  --  24  --   --   --  25 26  --   GLUCOSE 143*  --  110*  --  97  --   --   --  100* 90  --   BUN <5*  --  10  --  10  --   --   --  8 7  --   CREATININE 0.35*  --  0.62  --  0.64  --   --   --   0.52 0.51  --   CALCIUM 8.3*   < > 8.5*  --  8.6*  --   --   --  8.4* 8.1*  --   MG  --   --   --   --   --   --   --   --   --  1.9  --   PHOS  --   --   --   --   --   --   --   --   --  2.1*  --    < > = values in this interval not displayed.    Liver Function Tests: Recent Labs  Lab 05/30/20 1156 05/31/20 0357 06/01/20 0625 06/02/20 0343  AST 86* 141* 113* 65*  ALT 51* 93* 87* 65*  ALKPHOS 88 85 85 74  BILITOT 0.7 0.8 0.8 0.8  PROT 7.1 7.0 6.9 6.2*  ALBUMIN 3.7 3.4* 3.3* 2.9*   No results for input(s): LIPASE, AMYLASE in the last  168 hours. No results for input(s): AMMONIA in the last 168 hours.  CBC: Recent Labs  Lab 05/30/20 1156 05/31/20 0357 06/01/20 0625 06/02/20 0343  WBC 10.2 12.1* 9.3 10.3  NEUTROABS 8.6* 9.7* 7.4 8.1*  HGB 12.8 13.0 13.0 11.9*  HCT 34.5* 35.8* 36.5 34.3*  MCV 77.9* 78.3* 80.0 81.7  PLT 255 295 311 323    Cardiac Enzymes: No results for input(s): CKTOTAL, CKMB, CKMBINDEX, TROPONINI in the last 168 hours.  BNP: Invalid input(s): POCBNP  CBG: Recent Labs  Lab 05/30/20 1228 06/01/20 2014  GLUCAP 149* 106*    Microbiology: Results for orders placed or performed during the hospital encounter of 05/30/20  Culture, Urine     Status: Abnormal   Collection Time: 05/30/20 11:58 AM   Specimen: Urine, Random  Result Value Ref Range Status   Specimen Description   Final    URINE, RANDOM Performed at [facility], [address] [place], [place] [address]    Special Requests   Final    NONE Performed at [facility], [address] [place], [place] [address]    Culture MULTIPLE SPECIES PRESENT, SUGGEST RECOLLECTION (A)  Final   Report Status 05/31/2020 FINAL  Final  SARS Coronavirus 2 by RT PCR (hospital order, performed in [facility] hospital lab) Nasopharyngeal Nasopharyngeal Swab     Status: Abnormal   Collection Time: 05/30/20  1:23 PM   Specimen: Nasopharyngeal Swab  Result Value Ref Range Status    SARS Coronavirus 2 POSITIVE (A) NEGATIVE Final    Comment: RESULT CALLED TO, READ BACK BY AND VERIFIED WITH: ANNA JASPER 05/30/20 AT 1450 BY ACR (NOTE) SARS-CoV-2 target nucleic acids are DETECTED  SARS-CoV-2 RNA is generally detectable in upper respiratory specimens  during the acute phase of infection.  Positive results are indicative  of the presence of the identified virus, but do not rule out bacterial infection or co-infection with other pathogens not detected by the test.  Clinical correlation with patient history and  other diagnostic information is necessary to determine patient infection status.  The expected result is negative.  Fact Sheet for Patients:   BoilerBrush.com.cy   Fact Sheet for Healthcare Providers:   https://pope.com/    This test is not yet approved or cleared by the [place] FDA and  has been authorized for detection and/or diagnosis of SARS-CoV-2 by FDA under an Emergency Use Authorization (EUA).  This EUA will remain in effect (meaning this te st can be used) for the duration of  the COVID-19 declaration under Section 564(b)(1) of the Act, 21 U.S.C. section 360-bbb-3(b)(1), unless the authorization is terminated or revoked sooner.  Performed at [facility], [address] [place], [place] [address]     Coagulation Studies: No results for input(s): LABPROT, INR in the last 72 hours.  Urinalysis: No results for input(s): COLORURINE, LABSPEC, PHURINE, GLUCOSEU, HGBUR, BILIRUBINUR, KETONESUR, PROTEINUR, UROBILINOGEN, NITRITE, LEUKOCYTESUR in the last 72 hours.  Invalid input(s): APPERANCEUR    Imaging: DG Chest 1 View  Result Date: 06/02/2020 CLINICAL DATA:  Acute respiratory failure.  COVID. EXAM: CHEST  1 VIEW COMPARISON:  05/31/2020. FINDINGS: Mediastinum and hilar structures normal. Heart size normal. Diffuse bilateral interstitial prominence. Pneumonitis could present this  fashion. No pleural effusion or pneumothorax. IMPRESSION: Diffuse bilateral interstitial prominence consistent with pneumonitis. Similar findings noted on prior exam. Electronically Signed   By: Maisie Fus  Register   On: 06/02/2020 13:58     Medications:   . sodium chloride    .  sodium chloride 250 mL (06/02/20 0914)  . 0.9 % NaCl with KCl 20 mEq / L 50 mL/hr at 06/02/20 0610  . azithromycin 500 mg (06/02/20 1019)  . cefTRIAXone (ROCEPHIN)  IV 1 g (06/02/20 0915)  . famotidine (PEPCID) IV 20 mg (06/02/20 0916)  . potassium PHOSPHATE IVPB (in mmol) 20 mmol (06/02/20 0905)   . vitamin C  1,000 mg Oral TID  . cholecalciferol  400 Units Oral Daily  . dextromethorphan-guaiFENesin  1 tablet Oral BID  . heparin  5,000 Units Subcutaneous Q8H  . melatonin  5 mg Oral QHS  . sodium chloride flush  3 mL Intravenous Q12H  . zinc sulfate  220 mg Oral Daily   sodium chloride, sodium chloride, acetaminophen, docusate sodium, ondansetron (ZOFRAN) IV, polyethylene glycol, sodium chloride flush  Assessment/ Plan:  Ms. Taleshia Luff is a 59 y.o. white female with no significant medical history , who was admitted to [facility] on 05/30/2020 for Covid+, dehydrated, EMS  1. Hyponatremia consistent with hypovolemic hyponatremia Treated with hypertonic saline Na+ 128 today Encourage oral intake IVF on flow NS with 20 meq Potassium  2. Hypokalemia K+3.3 today IVF with Potassium on flow Will continue monitoring closely   LOS: 3 Leslie Langille 9/23/20212:21 PM  Patient was seen and examined with Denna Haggard, DNP.  Continue IV fluids for now. Encourage PO intake. Above plan was discussed and agreed upon on the signing of this note.   Lamont Dowdy, MD [facility]  9/23/20212:21 PM

## 2020-06-02 NOTE — Plan of Care (Signed)
  Problem: Education: Goal: Knowledge of risk factors and measures for prevention of condition will improve 06/02/2020 2249 by Sherlyn Lick, RN Outcome: Progressing 06/02/2020 2221 by Sherlyn Lick, RN Outcome: Progressing   Problem: Coping: Goal: Psychosocial and spiritual needs will be supported 06/02/2020 2249 by Sherlyn Lick, RN Outcome: Progressing 06/02/2020 2221 by Sherlyn Lick, RN Outcome: Progressing   Problem: Respiratory: Goal: Will maintain a patent airway 06/02/2020 2249 by Sherlyn Lick, RN Outcome: Progressing 06/02/2020 2221 by Sherlyn Lick, RN Outcome: Progressing Goal: Complications related to the disease process, condition or treatment will be avoided or minimized 06/02/2020 2249 by Sherlyn Lick, RN Outcome: Progressing 06/02/2020 2221 by Sherlyn Lick, RN Outcome: Progressing   Problem: Education: Goal: Knowledge of General Education information will improve Description: Including pain rating scale, medication(s)/side effects and non-pharmacologic comfort measures 06/02/2020 2249 by Sherlyn Lick, RN Outcome: Progressing 06/02/2020 2221 by Sherlyn Lick, RN Outcome: Progressing   Problem: Health Behavior/Discharge Planning: Goal: Ability to manage health-related needs will improve 06/02/2020 2249 by Sherlyn Lick, RN Outcome: Progressing 06/02/2020 2221 by Sherlyn Lick, RN Outcome: Progressing   Problem: Clinical Measurements: Goal: Ability to maintain clinical measurements within normal limits will improve 06/02/2020 2249 by Sherlyn Lick, RN Outcome: Progressing 06/02/2020 2221 by Sherlyn Lick, RN Outcome: Progressing Goal: Will remain free from infection 06/02/2020 2249 by Sherlyn Lick, RN Outcome: Progressing 06/02/2020 2221 by Sherlyn Lick, RN Outcome: Progressing Goal: Diagnostic test results will improve 06/02/2020 2249 by Sherlyn Lick, RN Outcome: Progressing 06/02/2020 2221 by Sherlyn Lick, RN Outcome: Progressing Goal: Respiratory complications will improve 06/02/2020 2249 by Sherlyn Lick, RN Outcome: Progressing 06/02/2020 2221 by Sherlyn Lick, RN Outcome: Progressing Goal: Cardiovascular complication will be avoided 06/02/2020 2249 by Sherlyn Lick, RN Outcome: Progressing 06/02/2020 2221 by Sherlyn Lick, RN Outcome: Progressing   Problem: Activity: Goal: Risk for activity intolerance will decrease 06/02/2020 2249 by Sherlyn Lick, RN Outcome: Progressing 06/02/2020 2221 by Sherlyn Lick, RN Outcome: Progressing   Problem: Nutrition: Goal: Adequate nutrition will be maintained 06/02/2020 2249 by Sherlyn Lick, RN Outcome: Progressing 06/02/2020 2221 by Sherlyn Lick, RN Outcome: Progressing   Problem: Coping: Goal: Level of anxiety will decrease 06/02/2020 2249 by Sherlyn Lick, RN Outcome: Progressing 06/02/2020 2221 by Sherlyn Lick, RN Outcome: Progressing   Problem: Elimination: Goal: Will not experience complications related to bowel motility 06/02/2020 2249 by Sherlyn Lick, RN Outcome: Progressing 06/02/2020 2221 by Sherlyn Lick, RN Outcome: Progressing Goal: Will not experience complications related to urinary retention 06/02/2020 2249 by Sherlyn Lick, RN Outcome: Progressing 06/02/2020 2221 by Sherlyn Lick, RN Outcome: Progressing   Problem: Pain Managment: Goal: General experience of comfort will improve 06/02/2020 2249 by Sherlyn Lick, RN Outcome: Progressing 06/02/2020 2221 by Sherlyn Lick, RN Outcome: Progressing   Problem: Safety: Goal: Ability to remain free from injury will improve 06/02/2020 2249 by Sherlyn Lick, RN Outcome: Progressing 06/02/2020 2221 by Sherlyn Lick, RN Outcome: Progressing   Problem: Skin Integrity: Goal: Risk for impaired skin integrity will decrease 06/02/2020 2249 by Sherlyn Lick, RN Outcome: Progressing 06/02/2020 2221 by Sherlyn Lick,  RN Outcome: Progressing

## 2020-06-02 NOTE — Progress Notes (Signed)
PT Cancellation Note  Patient Details Name: Karina Swanson MRN: [id number] DOB: [birth year]   Cancelled Treatment:    Reason Eval/Treat Not Completed: Medical issues which prohibited therapy (Pt recently having respiratory symptoms, now requiring O2. Pend Chest CT angio to r/o PE. Will hold PT evaluation until study results are available.)  2:24 PM, 06/02/20 Rosamaria Lints, PT, DPT Physical Therapist - [facility]  [phone] (ASCOM)    [name] 06/02/2020, 2:24 PM

## 2020-06-02 NOTE — Progress Notes (Signed)
[facility]  ROUNDING NOTE   Subjective:   Patient resting in bed, awake, alert, requesting to get discharged home.Na+ 128 today. Patient still has generalized fatigue per nursing report.She denies nausea, vomiting, but reports anorexia and poor oral intake.  Objective:  Vital signs in last 24 hours:  Temp:  [97.9 F (36.6 C)-98.9 F (37.2 C)] 97.9 F (36.6 C) (09/23 1110) Pulse Rate:  [60-84] 84 (09/23 1110) Resp:  [17-29] 22 (09/23 1110) BP: (93-120)/(64-84) 120/84 (09/23 1110) SpO2:  [92 %-97 %] 93 % (09/23 1110) Weight:  [54.4 kg] 54.4 kg (09/23 0416)  Weight change:  Filed Weights   05/30/20 1149 06/01/20 1730 06/02/20 0416  Weight: 54.4 kg 54.4 kg 54.4 kg    Intake/Output: I/O last 3 completed shifts: In: 1246.1 [P.O.:60; I.V.:836.1; IV Piggyback:350] Out: 0    Intake/Output this shift:  Total I/O In: -  Out: 50 [Urine:50]  Physical Exam: General: No acute distress, appears slightly lethargic  Head: Normocephalic, atraumatic. Moist oral mucosal membranes  Eyes: Anicteric  Neck: Supple, trachea midline  Lungs:  Normal and symmetrical respiratory effort,Lungs clear  Heart: S1S2 +Regular rate and rhythm  Abdomen:  Soft, nontender  Extremities:  no peripheral edema.  Neurologic: Alert, awake, answers questions appropriately  Skin: No acute rashes or lesions        Basic Metabolic Panel: Recent Labs  Lab 05/30/20 1156 05/30/20 1544 05/31/20 0357 05/31/20 0848 06/01/20 0625 06/01/20 0625 06/01/20 0901 06/01/20 1404 06/01/20 2300 06/02/20 0343 06/02/20 0843  NA 110*   < > 115*   < > 123*   < > 125* 127* 127* 131* 128*  K 2.9*  --  3.9  --  3.1*  --   --   --  3.3* 3.3*  --   CL 72*  --  80*  --  86*  --   --   --  91* 95*  --   CO2 25  --  23  --  24  --   --   --  25 26  --   GLUCOSE 143*  --  110*  --  97  --   --   --  100* 90  --   BUN <5*  --  10  --  10  --   --   --  8 7  --   CREATININE 0.35*  --  0.62  --  0.64  --   --   --   0.52 0.51  --   CALCIUM 8.3*   < > 8.5*  --  8.6*  --   --   --  8.4* 8.1*  --   MG  --   --   --   --   --   --   --   --   --  1.9  --   PHOS  --   --   --   --   --   --   --   --   --  2.1*  --    < > = values in this interval not displayed.    Liver Function Tests: Recent Labs  Lab 05/30/20 1156 05/31/20 0357 06/01/20 0625 06/02/20 0343  AST 86* 141* 113* 65*  ALT 51* 93* 87* 65*  ALKPHOS 88 85 85 74  BILITOT 0.7 0.8 0.8 0.8  PROT 7.1 7.0 6.9 6.2*  ALBUMIN 3.7 3.4* 3.3* 2.9*   No results for input(s): LIPASE, AMYLASE in the last  168 hours. No results for input(s): AMMONIA in the last 168 hours.  CBC: Recent Labs  Lab 05/30/20 1156 05/31/20 0357 06/01/20 0625 06/02/20 0343  WBC 10.2 12.1* 9.3 10.3  NEUTROABS 8.6* 9.7* 7.4 8.1*  HGB 12.8 13.0 13.0 11.9*  HCT 34.5* 35.8* 36.5 34.3*  MCV 77.9* 78.3* 80.0 81.7  PLT 255 295 311 323    Cardiac Enzymes: No results for input(s): CKTOTAL, CKMB, CKMBINDEX, TROPONINI in the last 168 hours.  BNP: Invalid input(s): POCBNP  CBG: Recent Labs  Lab 05/30/20 1228 06/01/20 2014  GLUCAP 149* 106*    Microbiology: Results for orders placed or performed during the hospital encounter of 05/30/20  Culture, Urine     Status: Abnormal   Collection Time: 05/30/20 11:58 AM   Specimen: Urine, Random  Result Value Ref Range Status   Specimen Description   Final    URINE, RANDOM Performed at [facility], [address] [place], [place] [address]    Special Requests   Final    NONE Performed at [facility], [address] [place], [place] [address]    Culture MULTIPLE SPECIES PRESENT, SUGGEST RECOLLECTION (A)  Final   Report Status 05/31/2020 FINAL  Final  SARS Coronavirus 2 by RT PCR (hospital order, performed in [facility] hospital lab) Nasopharyngeal Nasopharyngeal Swab     Status: Abnormal   Collection Time: 05/30/20  1:23 PM   Specimen: Nasopharyngeal Swab  Result Value Ref Range Status    SARS Coronavirus 2 POSITIVE (A) NEGATIVE Final    Comment: RESULT CALLED TO, READ BACK BY AND VERIFIED WITH: ANNA JASPER 05/30/20 AT 1450 BY ACR (NOTE) SARS-CoV-2 target nucleic acids are DETECTED  SARS-CoV-2 RNA is generally detectable in upper respiratory specimens  during the acute phase of infection.  Positive results are indicative  of the presence of the identified virus, but do not rule out bacterial infection or co-infection with other pathogens not detected by the test.  Clinical correlation with patient history and  other diagnostic information is necessary to determine patient infection status.  The expected result is negative.  Fact Sheet for Patients:   BoilerBrush.com.cy   Fact Sheet for Healthcare Providers:   https://pope.com/    This test is not yet approved or cleared by the [place] FDA and  has been authorized for detection and/or diagnosis of SARS-CoV-2 by FDA under an Emergency Use Authorization (EUA).  This EUA will remain in effect (meaning this te st can be used) for the duration of  the COVID-19 declaration under Section 564(b)(1) of the Act, 21 U.S.C. section 360-bbb-3(b)(1), unless the authorization is terminated or revoked sooner.  Performed at [facility], [address] [place], [place] [address]     Coagulation Studies: No results for input(s): LABPROT, INR in the last 72 hours.  Urinalysis: Recent Labs    05/30/20 1323  COLORURINE YELLOW*  LABSPEC 1.018  PHURINE 6.0  GLUCOSEU 50*  HGBUR SMALL*  BILIRUBINUR NEGATIVE  KETONESUR 20*  PROTEINUR >=300*  NITRITE NEGATIVE  LEUKOCYTESUR TRACE*      Imaging: No results found.   Medications:   . sodium chloride    . sodium chloride 250 mL (06/02/20 0914)  . 0.9 % NaCl with KCl 20 mEq / L 50 mL/hr at 06/02/20 0610  . azithromycin 500 mg (06/02/20 1019)  . cefTRIAXone (ROCEPHIN)  IV 1 g (06/02/20 0915)  . famotidine  (PEPCID) IV 20 mg (06/02/20 0916)  . potassium PHOSPHATE IVPB (in mmol)  20 mmol (06/02/20 0905)   . vitamin C  1,000 mg Oral TID  . cholecalciferol  400 Units Oral Daily  . dextromethorphan-guaiFENesin  1 tablet Oral BID  . heparin  5,000 Units Subcutaneous Q8H  . melatonin  5 mg Oral QHS  . sodium chloride flush  3 mL Intravenous Q12H  . zinc sulfate  220 mg Oral Daily   sodium chloride, sodium chloride, acetaminophen, docusate sodium, ondansetron (ZOFRAN) IV, polyethylene glycol, sodium chloride flush  Assessment/ Plan:  [name] is a 59 y.o. white female with no significant medical history , who was admitted to [facility] on 05/30/2020 for Covid+, dehydrated, EMS  1. Hyponatremia consistent with hypovolemic hyponatremia Treated with hypertonic saline Na+ 128 today Encourage oral intake IVF on flow NS with 20 meq Potassium  2. Hypokalemia K+3.3 today IVF with Potassium on flow Will continue monitoring closely   LOS: 3 Marshe Shrestha 9/23/20211:08 PM

## 2020-06-02 NOTE — Plan of Care (Signed)
°  Problem: Respiratory: Goal: Will maintain a patent airway Outcome: Progressing   Problem: Clinical Measurements: Goal: Ability to maintain clinical measurements within normal limits will improve Outcome: Progressing   Problem: Education: Goal: Knowledge of risk factors and measures for prevention of condition will improve Outcome: Progressing   Problem: Respiratory: Goal: Will maintain a patent airway Outcome: Progressing   Problem: Clinical Measurements: Goal: Ability to maintain clinical measurements within normal limits will improve Outcome: Progressing

## 2020-06-02 NOTE — Progress Notes (Signed)
Cross Cover Note CT scan results reviewed with patient and recommendations for remdesivir. Patient had already discussed with son earlier and has agreed to treatment.  She denied questions regarding purpose or side effects of drug.  Remdesivir per pharmacy consult placed in chart

## 2020-06-02 NOTE — Progress Notes (Signed)
PROGRESS NOTE    Karina Swanson  WUJ:[id number] DOB: 06/19/1961 DOA: 05/30/2020 PCP: Johny BlamerHarris, William, MD   Brief Narrative:  59 y.o. Female admitted with Acute Metabolic Encephalopathy in the setting of Hypotonic Hypovolemic Hyponatremia requiring Hypertonic Saline.  Also found to have COVID-19, but pt without hypoxia or respiratory symptoms, and has normal CXR. Nephrology consulted and managing pt's severe hyponatremia with mental status changes. Level has been steadily Improving.  Per notes, she has been having about a 2 week history of fever/chills, cough.  She has a COVID test performed last week on 05/26/2020 by her PCP, but results are not available.  She was reported to be lethargic and confused with poor PO intake since last night, therefore her family brought her to ED.  Upon presentation to the ED, her vital signs are within normal limits (RR 16, HR 85, BP 119/69, 100% SpO2 on room air), and no signs of respiratory symptoms.  Initial workup revealed serum Na 110, potassium 2.9, BUN <5, Creatinine < 0.35, glucose 143, AST 86, ALT 51, WBC 10.2 with Neutrophilia, procalcitonin 0.18, serum osmolality 237, urine osmolality 457, and Urine Na 19.  Her COVID-19 PCR is positive.  Her CXR is normal.  Urinalysis with trace leukocytes.  Nephrology was consulted who recommended placing on 3% Hypertonic Saline infusion at 20 cc/hr.  PCCM is asked to admit the pt to ICU for further workup and treatment of Acute Metabolic Encephalopathy in the setting of Hypotonic Hypovolemic Hyponatremia requiring 3% Hypertonic Saline.   Blood pressure 109/65, pulse 88, temperature 98.3 F (36.8 C), temperature source Oral, resp. rate 20, height 5' (1.524 m), weight 54.4 kg, SpO2 93 %.   Physical Exam Vitals reviewed.  Constitutional:      Appearance: Normal appearance. She is normal weight.  HENT:     Head: Normocephalic and atraumatic.     Right Ear: External ear normal.     Left Ear: External ear  normal.  Eyes:     Extraocular Movements: Extraocular movements intact.  Cardiovascular:     Rate and Rhythm: Normal rate and regular rhythm.     Pulses: Normal pulses.     Heart sounds: Normal heart sounds.  Pulmonary:     Effort: Pulmonary effort is normal.     Breath sounds: Normal breath sounds.  Abdominal:     Palpations: Abdomen is soft.  Skin:    General: Skin is warm.  Neurological:     General: No focal deficit present.     Mental Status: She is alert and oriented to person, place, and time. Mental status is at baseline.  Psychiatric:        Mood and Affect: Mood normal.    Assessment & Plan:   Principal Problem:   Acute hypoxemic respiratory failure due to COVID-19 [facility](HCC) Active Problems:   Hyponatremia  A/C hypoxic respiratory failure due to covid-19 : -Pt has started to require oxygen 2L [address] to keep her o2 sats above 90%. Her oxygen saturation dropped today to 88% and was started on 2L [place]. -prone position for improved oxygenation.  -pt started on solumedrol after getting permission from son [name].  -family hesitant towards remdesivir and per Dr.kasa's note Baricitinab when pt admitted due to side effect panel. COVID-19 Labs  Recent Labs    05/31/20 0357 06/01/20 0625 06/01/20 2300 06/02/20 0343  FERRITIN 1,497* 1,140*  --  1,036*  CRP 5.0*  --  6.7* 5.4*    Lab Results  Component Value Date  SARSCOV2NAA POSITIVE (A) 05/30/2020   SpO2: 93 % O2 Flow Rate (L/min): 2 L/min    Hyponatremia: continue ivf at 50 cc hour. Serum sodium check. Pt is stable neurologically and anticipate d/c tomorrow to home PT consult.  DVT prophylaxis:  Code Status:  Family Communication:  Disposition Plan:   Status is: Inpatient  Dispo: The patient is from: Home              Anticipated d/c is to: Home              Anticipated d/c date is: > 3 days              Patient currently is not medically stable to d/c. Consultants:    Nephrology   Antimicrobials:  Anti-infectives (From admission, onward)   Start     Dose/Rate Route Frequency Ordered Stop   05/31/20 0800  azithromycin (ZITHROMAX) 500 mg in sodium chloride 0.9 % 250 mL IVPB        500 mg 250 mL/hr over 60 Minutes Intravenous Every 24 hours 05/31/20 0720     05/31/20 0800  cefTRIAXone (ROCEPHIN) 1 g in sodium chloride 0.9 % 100 mL IVPB        1 g 200 mL/hr over 30 Minutes Intravenous Every 24 hours 05/31/20 0720         Subjective: Pt is alert and awake and is stable but weak from her Gastroenteritis attributed to covid-19 infection. PT has been asymptomatic and came for hyponatremia and was initially treated with hypertonic saline which has been d./c and pt is on ns.   9/23 Pt is stable on 2L  and CRP is improving and we will follow.  CTA chest to rule out PE and any bacterial infection.   Objective: Vitals:   06/02/20 0817 06/02/20 0837 06/02/20 1110 06/02/20 1537  BP: 120/70  120/84 109/65  Pulse: 84  84 88  Resp: (!) 22  (!) 22 20  Temp: 98.8 F (37.1 C)  97.9 F (36.6 C) 98.3 F (36.8 C)  TempSrc: Oral  Oral Oral  SpO2: 92% 92% 93% 93%  Weight:      Height:        Intake/Output Summary (Last 24 hours) at 06/02/2020 1724 Last data filed at 06/02/2020 0817 Gross per 24 hour  Intake 896.09 ml  Output 50 ml  Net 846.09 ml   Filed Weights   05/30/20 1149 06/01/20 1730 06/02/20 0416  Weight: 54.4 kg 54.4 kg 54.4 kg    Examination:  General exam: Appears calm and comfortable  Respiratory system: Clear to auscultation. Respiratory effort normal. Cardiovascular system: S1 & S2 heard, RRR. No JVD, murmurs, rubs, gallops or clicks. No pedal edema. Gastrointestinal system: Abdomen is nondistended, soft and nontender. No organomegaly or masses felt. Normal bowel sounds heard. Central nervous system: Alert and oriented. No focal neurological deficits. Extremities: Symmetric 5 x 5 power. Skin: No rashes, lesions or  ulcers Psychiatry: Judgement and insight appear normal. Mood & affect appropriate.     Data Reviewed: I have personally reviewed following labs and imaging studies  I/O last 3 completed shifts: In: 1246.1 [P.O.:60; I.V.:836.1; IV Piggyback:350] Out: 0  Total I/O In: -  Out: 50 [Urine:50] Lab Results  Component Value Date   CREATININE 0.51 06/02/2020   CREATININE 0.52 06/01/2020   CREATININE 0.64 06/01/2020   CBC: Recent Labs  Lab 05/30/20 1156 05/31/20 0357 06/01/20 0625 06/02/20 0343  WBC 10.2 12.1* 9.3 10.3  NEUTROABS 8.6* 9.7* 7.4  8.1*  HGB 12.8 13.0 13.0 11.9*  HCT 34.5* 35.8* 36.5 34.3*  MCV 77.9* 78.3* 80.0 81.7  PLT 255 295 311 323   Basic Metabolic Panel: Recent Labs  Lab 05/30/20 1156 05/30/20 1544 05/31/20 0357 05/31/20 0848 06/01/20 0625 06/01/20 0901 06/01/20 1404 06/01/20 2300 06/02/20 0343 06/02/20 0843 06/02/20 1423  NA 110*   < > 115*   < > 123*   < > 127* 127* 131* 128* 128*  K 2.9*  --  3.9  --  3.1*  --   --  3.3* 3.3*  --   --   CL 72*  --  80*  --  86*  --   --  91* 95*  --   --   CO2 25  --  23  --  24  --   --  25 26  --   --   GLUCOSE 143*  --  110*  --  97  --   --  100* 90  --   --   BUN <5*  --  10  --  10  --   --  8 7  --   --   CREATININE 0.35*  --  0.62  --  0.64  --   --  0.52 0.51  --   --   CALCIUM 8.3*  --  8.5*  --  8.6*  --   --  8.4* 8.1*  --   --   MG  --   --   --   --   --   --   --   --  1.9  --   --   PHOS  --   --   --   --   --   --   --   --  2.1*  --   --    < > = values in this interval not displayed.   GFR: Estimated Creatinine Clearance: 54.4 mL/min (by C-G formula based on SCr of 0.51 mg/dL). Liver Function Tests: Recent Labs  Lab 05/30/20 1156 05/31/20 0357 06/01/20 0625 06/02/20 0343  AST 86* 141* 113* 65*  ALT 51* 93* 87* 65*  ALKPHOS 88 85 85 74  BILITOT 0.7 0.8 0.8 0.8  PROT 7.1 7.0 6.9 6.2*  ALBUMIN 3.7 3.4* 3.3* 2.9*   No results for input(s): LIPASE, AMYLASE in the last 168 hours. No  results for input(s): AMMONIA in the last 168 hours. Coagulation Profile: No results for input(s): INR, PROTIME in the last 168 hours. Cardiac Enzymes: No results for input(s): CKTOTAL, CKMB, CKMBINDEX, TROPONINI in the last 168 hours. BNP (last 3 results) No results for input(s): PROBNP in the last 8760 hours. HbA1C: No results for input(s): HGBA1C in the last 72 hours. CBG: Recent Labs  Lab 05/30/20 1228 06/01/20 2014  GLUCAP 149* 106*   Lipid Profile: No results for input(s): CHOL, HDL, LDLCALC, TRIG, CHOLHDL, LDLDIRECT in the last 72 hours. Thyroid Function Tests: No results for input(s): TSH, T4TOTAL, FREET4, T3FREE, THYROIDAB in the last 72 hours. Anemia Panel: Recent Labs    06/01/20 0625 06/02/20 0343  FERRITIN 1,140* 1,036*   Sepsis Labs: Recent Labs  Lab 05/30/20 2100 05/31/20 0357 06/01/20 0625 06/02/20 0843  PROCALCITON 0.18 0.33 0.17 <0.10    Recent Results (from the past 240 hour(s))  Culture, Urine     Status: Abnormal   Collection Time: 05/30/20 11:58 AM   Specimen: Urine, Random  Result Value Ref Range Status  Specimen Description   Final    URINE, RANDOM Performed at [facility], [address] [place], [place] [address]    Special Requests   Final    NONE Performed at [facility], [address] [place], [place] [address]    Culture MULTIPLE SPECIES PRESENT, SUGGEST RECOLLECTION (A)  Final   Report Status 05/31/2020 FINAL  Final  SARS Coronavirus 2 by RT PCR (hospital order, performed in [facility] hospital lab) Nasopharyngeal Nasopharyngeal Swab     Status: Abnormal   Collection Time: 05/30/20  1:23 PM   Specimen: Nasopharyngeal Swab  Result Value Ref Range Status   SARS Coronavirus 2 POSITIVE (A) NEGATIVE Final    Comment: RESULT CALLED TO, READ BACK BY AND VERIFIED WITH: ANNA JASPER 05/30/20 AT 1450 BY ACR (NOTE) SARS-CoV-2 target nucleic acids are DETECTED  SARS-CoV-2 RNA is generally detectable in upper  respiratory specimens  during the acute phase of infection.  Positive results are indicative  of the presence of the identified virus, but do not rule out bacterial infection or co-infection with other pathogens not detected by the test.  Clinical correlation with patient history and  other diagnostic information is necessary to determine patient infection status.  The expected result is negative.  Fact Sheet for Patients:   BoilerBrush.com.cy   Fact Sheet for Healthcare Providers:   https://pope.com/    This test is not yet approved or cleared by the [place] FDA and  has been authorized for detection and/or diagnosis of SARS-CoV-2 by FDA under an Emergency Use Authorization (EUA).  This EUA will remain in effect (meaning this te st can be used) for the duration of  the COVID-19 declaration under Section 564(b)(1) of the Act, 21 U.S.C. section 360-bbb-3(b)(1), unless the authorization is terminated or revoked sooner.  Performed at [facility], [address], [place], [place] [address]          Radiology Studies: DG Chest 1 View  Result Date: 06/02/2020 CLINICAL DATA:  Acute respiratory failure.  COVID. EXAM: CHEST  1 VIEW COMPARISON:  05/31/2020. FINDINGS: Mediastinum and hilar structures normal. Heart size normal. Diffuse bilateral interstitial prominence. Pneumonitis could present this fashion. No pleural effusion or pneumothorax. IMPRESSION: Diffuse bilateral interstitial prominence consistent with pneumonitis. Similar findings noted on prior exam. Electronically Signed   By: Maisie Fus  Register   On: 06/02/2020 13:58        Scheduled Meds: . vitamin C  1,000 mg Oral TID  . cholecalciferol  400 Units Oral Daily  . dextromethorphan-guaiFENesin  1 tablet Oral BID  . enoxaparin (LOVENOX) injection  40 mg Subcutaneous Q24H  . melatonin  5 mg Oral QHS  . methylPREDNISolone (SOLU-MEDROL) injection  40 mg  Intravenous Q12H  . sodium chloride flush  3 mL Intravenous Q12H  . zinc sulfate  220 mg Oral Daily   Continuous Infusions: . sodium chloride    . sodium chloride 250 mL (06/02/20 0914)  . 0.9 % NaCl with KCl 20 mEq / L 50 mL/hr at 06/02/20 0610  . azithromycin 500 mg (06/02/20 1019)  . cefTRIAXone (ROCEPHIN)  IV 1 g (06/02/20 0915)  . famotidine (PEPCID) IV 20 mg (06/02/20 0916)     LOS: 3 days    Gertha Calkin, MD [facility] Pager [phone] If 7PM-7AM, please contact night-coverage [email or web address] Password [facility] 06/02/2020, 5:24 PM

## 2020-06-03 LAB — COMPREHENSIVE METABOLIC PANEL
ALT: 52 U/L — ABNORMAL HIGH (ref 0–44)
AST: 44 U/L — ABNORMAL HIGH (ref 15–41)
Albumin: 2.7 g/dL — ABNORMAL LOW (ref 3.5–5.0)
Alkaline Phosphatase: 71 U/L (ref 38–126)
Anion gap: 10 (ref 5–15)
BUN: 5 mg/dL — ABNORMAL LOW (ref 6–20)
CO2: 25 mmol/L (ref 22–32)
Calcium: 8.3 mg/dL — ABNORMAL LOW (ref 8.9–10.3)
Chloride: 97 mmol/L — ABNORMAL LOW (ref 98–111)
Creatinine, Ser: 0.48 mg/dL (ref 0.44–1.00)
GFR calc Af Amer: >60 mL/min (ref >60)
GFR calc non Af Amer: >60 mL/min (ref >60)
Glucose, Bld: 130 mg/dL — ABNORMAL HIGH (ref 70–99)
Potassium: 3.3 mmol/L — ABNORMAL LOW (ref 3.5–5.1)
Sodium: 132 mmol/L — ABNORMAL LOW (ref 135–145)
Total Bilirubin: 0.5 mg/dL (ref 0.3–1.2)
Total Protein: 6 g/dL — ABNORMAL LOW (ref 6.5–8.1)

## 2020-06-03 LAB — SODIUM
Sodium: 133 mmol/L — ABNORMAL LOW (ref 135–145)
Sodium: 135 mmol/L (ref 135–145)

## 2020-06-03 LAB — FERRITIN: Ferritin: 786 ng/mL — ABNORMAL HIGH (ref 11–307)

## 2020-06-03 LAB — LACTATE DEHYDROGENASE: LDH: 254 U/L — ABNORMAL HIGH (ref 98–192)

## 2020-06-03 LAB — PROCALCITONIN: Procalcitonin: <0.1 ng/mL

## 2020-06-03 LAB — FIBRIN DERIVATIVES D-DIMER (ARMC ONLY): Fibrin derivatives D-dimer (ARMC): 1405.76 ng/mL (FEU) — ABNORMAL HIGH (ref 0.00–499.00)

## 2020-06-03 MED ORDER — FAMOTIDINE 20 MG PO TABS
20.0000 mg | ORAL_TABLET | Freq: Two times a day (BID) | ORAL | Status: DC
  Administered 2020-06-03 – 2020-06-06 (×6): 20 mg via ORAL
  Filled 2020-06-03 (×6): qty 1

## 2020-06-03 MED ORDER — POTASSIUM CHLORIDE 20 MEQ PO PACK
20.0000 meq | PACK | Freq: Once | ORAL | Status: AC
  Administered 2020-06-03: 20 meq via ORAL
  Filled 2020-06-03: qty 1

## 2020-06-03 MED ORDER — AZITHROMYCIN 500 MG PO TABS
500.0000 mg | ORAL_TABLET | Freq: Once | ORAL | Status: AC
  Administered 2020-06-04: 500 mg via ORAL
  Filled 2020-06-03: qty 1

## 2020-06-03 MED ORDER — CEFDINIR 300 MG PO CAPS
300.0000 mg | ORAL_CAPSULE | Freq: Two times a day (BID) | ORAL | Status: AC
  Administered 2020-06-04 (×2): 300 mg via ORAL
  Filled 2020-06-03 (×2): qty 1

## 2020-06-03 NOTE — Progress Notes (Signed)
PROGRESS NOTE    Karina Swanson  VHQ:[id number] DOB: 08/09/1961 DOA: 05/30/2020 PCP: Johny BlamerHarris, William, MD   Brief Narrative:  59 y.o. Female admitted with Acute Metabolic Encephalopathy in the setting of Hypotonic Hypovolemic Hyponatremia requiring Hypertonic Saline.  Also found to have COVID-19, but pt without hypoxia or respiratory symptoms, and has normal CXR. Nephrology consulted and managing pt's severe hyponatremia with mental status changes. Level has been steadily Improving.  Per notes, she has been having about a 2 week history of fever/chills, cough.  She has a COVID test performed last week on 05/26/2020 by her PCP, but results are not available.  She was reported to be lethargic and confused with poor PO intake since last night, therefore her family brought her to ED.  Upon presentation to the ED, her vital signs are within normal limits (RR 16, HR 85, BP 119/69, 100% SpO2 on room air), and no signs of respiratory symptoms.  Initial workup revealed serum Na 110, potassium 2.9, BUN <5, Creatinine < 0.35, glucose 143, AST 86, ALT 51, WBC 10.2 with Neutrophilia, procalcitonin 0.18, serum osmolality 237, urine osmolality 457, and Urine Na 19.  Her COVID-19 PCR is positive.  Her CXR is normal.  Urinalysis with trace leukocytes.  Nephrology was consulted who recommended placing on 3% Hypertonic Saline infusion at 20 cc/hr.  PCCM is asked to admit the pt to ICU for further workup and treatment of Acute Metabolic Encephalopathy in the setting of Hypotonic Hypovolemic Hyponatremia requiring 3% Hypertonic Saline.   Blood pressure 130/85, pulse 94, temperature 98.4 F (36.9 C), temperature source Oral, resp. rate 20, height 5' (1.524 m), weight 56.1 kg, SpO2 95 %.   Physical Exam Vitals reviewed.  Constitutional:      Appearance: Normal appearance. She is normal weight.  HENT:     Head: Normocephalic and atraumatic.     Right Ear: External ear normal.     Left Ear: External ear  normal.  Eyes:     Extraocular Movements: Extraocular movements intact.  Cardiovascular:     Rate and Rhythm: Normal rate and regular rhythm.     Pulses: Normal pulses.     Heart sounds: Normal heart sounds.  Pulmonary:     Effort: Pulmonary effort is normal.     Breath sounds: Normal breath sounds.  Abdominal:     Palpations: Abdomen is soft.  Skin:    General: Skin is warm.  Neurological:     General: No focal deficit present.     Mental Status: She is alert and oriented to person, place, and time. Mental status is at baseline.  Psychiatric:        Mood and Affect: Mood normal.    Assessment & Plan:   Principal Problem:   Acute hypoxemic respiratory failure due to COVID-19 [facility](HCC) Active Problems:   Hyponatremia  A/C hypoxic respiratory failure due to covid-19 : -Pt has started to require oxygen 2L [address] to keep her o2 sats above 90%. Her oxygen saturation dropped today to 88% and was started on 2L [place]. -prone position for improved oxygenation.  -pt started on solumedrol after getting permission from son [name].  -family hesitant towards remdesivir and per Dr.kasa's note Baricitinab when pt admitted due to side effect panel. -yesterday we did cta chest which was negative for pe and pt has responded well to remdesivir and solumedrol. Pt has been proning and have called son and d/w him good new and plan for discharge. Family does not want mom to be  discharged until weekend Is over.   COVID-19 Labs  Recent Labs    06/01/20 0625 06/01/20 2300 06/02/20 0343 06/03/20 0604  FERRITIN 1,140*  --  1,036* 786*  LDH  --   --   --  254*  CRP  --  6.7* 5.4*  --     Lab Results  Component Value Date   SARSCOV2NAA POSITIVE (A) 05/30/2020   SpO2: 95 % O2 Flow Rate (L/min): 2 L/min    Hyponatremia: continue ivf at 50 cc hour. Serum sodium check. Pt is stable neurologically and anticipate d/c tomorrow to home PT consult appreciated. Cont ivf.  Hypokalemia: Replaced  and will follow.  Transamnitis : Present for admission is improving.   DVT prophylaxis: lovenox. Code Status: Full Family Communication: johhny Disposition Plan: home with PT.  Status is: Inpatient  Dispo: The patient is from: Home              Anticipated d/c is to: Home              Anticipated d/c date is: > 3 days              Patient currently is not medically stable to d/c. Consultants:   Nephrology   Antimicrobials:  Anti-infectives (From admission, onward)   Start     Dose/Rate Route Frequency Ordered Stop   06/04/20 1000  azithromycin (ZITHROMAX) tablet 500 mg        500 mg Oral  Once 06/03/20 1216     06/04/20 1000  cefdinir (OMNICEF) capsule 300 mg        300 mg Oral Every 12 hours 06/03/20 1216 06/05/20 0959   06/03/20 1000  remdesivir 100 mg in sodium chloride 0.9 % 100 mL IVPB       "Followed by" Linked Group Details   100 mg 200 mL/hr over 30 Minutes Intravenous Daily 06/02/20 2109 06/07/20 0959   06/02/20 2200  remdesivir 200 mg in sodium chloride 0.9% 250 mL IVPB       "Followed by" Linked Group Details   200 mg 580 mL/hr over 30 Minutes Intravenous Once 06/02/20 2109 06/02/20 2229   05/31/20 0800  azithromycin (ZITHROMAX) 500 mg in sodium chloride 0.9 % 250 mL IVPB  Status:  Discontinued        500 mg 250 mL/hr over 60 Minutes Intravenous Every 24 hours 05/31/20 0720 06/03/20 1216   05/31/20 0800  cefTRIAXone (ROCEPHIN) 1 g in sodium chloride 0.9 % 100 mL IVPB  Status:  Discontinued        1 g 200 mL/hr over 30 Minutes Intravenous Every 24 hours 05/31/20 0720 06/03/20 1216       Subjective: Pt is alert and awake and is stable but weak from her Gastroenteritis attributed to covid-19 infection. PT has been asymptomatic and came for hyponatremia and was initially treated with hypertonic saline which has been d./c and pt is on ns.   9/23 Pt is stable on 2L [place] and CRP is improving and we will follow.  CTA chest to rule out PE and any bacterial  infection.  9/24 Pt is alert awake and oriented and denies any complaints is proning and labs are improving.    Objective: Vitals:   06/03/20 0600 06/03/20 0800 06/03/20 1000 06/03/20 1154  BP:  (!) 110/54  130/85  Pulse: 71 99 84 94  Resp: (!) 25 (!) 21 (!) 22 20  Temp:    98.4 F (36.9 C)  TempSrc:  Oral  SpO2: 93% 93% 97% 95%  Weight:      Height:        Intake/Output Summary (Last 24 hours) at 06/03/2020 1326 Last data filed at 06/03/2020 0843 Gross per 24 hour  Intake 240 ml  Output 0 ml  Net 240 ml   Filed Weights   06/01/20 1730 06/02/20 0416 06/03/20 0400  Weight: 54.4 kg 54.4 kg 56.1 kg    Examination: Blood pressure 130/85, pulse 94, temperature 98.4 F (36.9 C), temperature source Oral, resp. rate 20, height 5' (1.524 m), weight 56.1 kg, SpO2 95 %. General exam: Appears calm and comfortable  Respiratory system: Clear to auscultation. Respiratory effort normal. Cardiovascular system: S1 & S2 heard, RRR. No JVD, murmurs, rubs, gallops or clicks. No pedal edema. Gastrointestinal system: Abdomen is nondistended, soft and nontender. No organomegaly or masses felt. Normal bowel sounds heard. Central nervous system: Alert and oriented. No focal neurological deficits. Extremities: Symmetric 5 x 5 power. Skin: No rashes, lesions or ulcers Psychiatry: Judgement and insight appear normal. Mood & affect appropriate.   Data Reviewed: I have personally reviewed following labs and imaging studies  I/O last 3 completed shifts: In: 447.2 [I.V.:447.2] Out: 50 [Urine:50] Total I/O In: 240 [P.O.:240] Out: -  Lab Results  Component Value Date   CREATININE 0.48 06/03/2020   CREATININE 0.51 06/02/2020   CREATININE 0.52 06/01/2020   CBC: Recent Labs  Lab 05/30/20 1156 05/31/20 0357 06/01/20 0625 06/02/20 0343  WBC 10.2 12.1* 9.3 10.3  NEUTROABS 8.6* 9.7* 7.4 8.1*  HGB 12.8 13.0 13.0 11.9*  HCT 34.5* 35.8* 36.5 34.3*  MCV 77.9* 78.3* 80.0 81.7  PLT 255 295 311  323   Basic Metabolic Panel: Recent Labs  Lab 05/31/20 0357 05/31/20 0848 06/01/20 0625 06/01/20 0901 06/01/20 2300 06/01/20 2300 06/02/20 0343 06/02/20 0343 06/02/20 0843 06/02/20 1423 06/02/20 2030 06/03/20 0604 06/03/20 0907  NA 115*   < > 123*   < > 127*   < > 131*   < > 128* 128* 127* 132* 133*  K 3.9  --  3.1*  --  3.3*  --  3.3*  --   --   --   --  3.3*  --   CL 80*  --  86*  --  91*  --  95*  --   --   --   --  97*  --   CO2 23  --  24  --  25  --  26  --   --   --   --  25  --   GLUCOSE 110*  --  97  --  100*  --  90  --   --   --   --  130*  --   BUN 10  --  10  --  8  --  7  --   --   --   --  5*  --   CREATININE 0.62  --  0.64  --  0.52  --  0.51  --   --   --   --  0.48  --   CALCIUM 8.5*  --  8.6*  --  8.4*  --  8.1*  --   --   --   --  8.3*  --   MG  --   --   --   --   --   --  1.9  --   --   --   --   --   --  PHOS  --   --   --   --   --   --  2.1*  --   --   --   --   --   --    < > = values in this interval not displayed.   GFR: Estimated Creatinine Clearance: 59.4 mL/min (by C-G formula based on SCr of 0.48 mg/dL). Liver Function Tests: Recent Labs  Lab 05/30/20 1156 05/31/20 0357 06/01/20 0625 06/02/20 0343 06/03/20 0604  AST 86* 141* 113* 65* 44*  ALT 51* 93* 87* 65* 52*  ALKPHOS 88 85 85 74 71  BILITOT 0.7 0.8 0.8 0.8 0.5  PROT 7.1 7.0 6.9 6.2* 6.0*  ALBUMIN 3.7 3.4* 3.3* 2.9* 2.7*   CBG: Recent Labs  Lab 05/30/20 1228 06/01/20 2014  GLUCAP 149* 106*   Anemia Panel: Recent Labs    06/02/20 0343 06/03/20 0604  FERRITIN 1,036* 786*   Sepsis Labs: Recent Labs  Lab 05/31/20 0357 06/01/20 0625 06/02/20 0843 06/03/20 0604  PROCALCITON 0.33 0.17 <0.10 <0.10    Recent Results (from the past 240 hour(s))  Culture, Urine     Status: Abnormal   Collection Time: 05/30/20 11:58 AM   Specimen: Urine, Random  Result Value Ref Range Status   Specimen Description   Final    URINE, RANDOM Performed at [facility], [address] [place], [place] [address]    Special Requests   Final    NONE Performed at [facility], [address] [place], [place] [address]    Culture MULTIPLE SPECIES PRESENT, SUGGEST RECOLLECTION (A)  Final   Report Status 05/31/2020 FINAL  Final  SARS Coronavirus 2 by RT PCR (hospital order, performed in [facility] hospital lab) Nasopharyngeal Nasopharyngeal Swab     Status: Abnormal   Collection Time: 05/30/20  1:23 PM   Specimen: Nasopharyngeal Swab  Result Value Ref Range Status   SARS Coronavirus 2 POSITIVE (A) NEGATIVE Final    Comment: RESULT CALLED TO, READ BACK BY AND VERIFIED WITH: ANNA JASPER 05/30/20 AT 1450 BY ACR (NOTE) SARS-CoV-2 target nucleic acids are DETECTED  SARS-CoV-2 RNA is generally detectable in upper respiratory specimens  during the acute phase of infection.  Positive results are indicative  of the presence of the identified virus, but do not rule out bacterial infection or co-infection with other pathogens not detected by the test.  Clinical correlation with patient history and  other diagnostic information is necessary to determine patient infection status.  The expected result is negative.  Fact Sheet for Patients:   BoilerBrush.com.cy   Fact Sheet for Healthcare Providers:   https://pope.com/    This test is not yet approved or cleared by the [place] FDA and  has been authorized for detection and/or diagnosis of SARS-CoV-2 by FDA under an Emergency Use Authorization (EUA).  This EUA will remain in effect (meaning this te st can be used) for the duration of  the COVID-19 declaration under Section 564(b)(1) of the Act, 21 U.S.C. section 360-bbb-3(b)(1), unless the authorization is terminated or revoked sooner.  Performed at [facility], [address] [place], [place] [address]          Radiology Studies: DG Chest 1 View  Result Date: 06/02/2020 CLINICAL  DATA:  Acute respiratory failure.  COVID. EXAM: CHEST  1 VIEW COMPARISON:  05/31/2020. FINDINGS: Mediastinum and hilar structures normal. Heart size normal. Diffuse bilateral interstitial prominence. Pneumonitis could present this fashion. No pleural effusion or pneumothorax.  IMPRESSION: Diffuse bilateral interstitial prominence consistent with pneumonitis. Similar findings noted on prior exam. Electronically Signed   By: Maisie Fus  Register   On: 06/02/2020 13:58   CT ANGIO CHEST PE W OR WO CONTRAST  Result Date: 06/02/2020 CLINICAL DATA:  Respiratory failure EXAM: CT ANGIOGRAPHY CHEST WITH CONTRAST TECHNIQUE: Multidetector CT imaging of the chest was performed using the standard protocol during bolus administration of intravenous contrast. Multiplanar CT image reconstructions and MIPs were obtained to evaluate the vascular anatomy. CONTRAST:  75mL OMNIPAQUE IOHEXOL 350 MG/ML SOLN COMPARISON:  None. FINDINGS: Cardiovascular: Contrast injection is sufficient to demonstrate satisfactory opacification of the pulmonary arteries to the segmental level. There is no pulmonary embolus or evidence of right heart strain. The size of the main pulmonary artery is normal. Heart size is normal, with no pericardial effusion. The course and caliber of the aorta are normal. There is no atherosclerotic calcification. Opacification decreased due to pulmonary arterial phase contrast bolus timing. Mediastinum/Nodes: -- No mediastinal lymphadenopathy. -- No hilar lymphadenopathy. -- No axillary lymphadenopathy. -- No supraclavicular lymphadenopathy. -- Normal thyroid gland where visualized. -  Unremarkable esophagus. Lungs/Pleura: There are diffuse bilateral ground-glass airspace opacities. There is no pneumothorax. No significant pleural effusion. There is atelectasis at the lung bases. Upper Abdomen: Contrast bolus timing is not optimized for evaluation of the abdominal organs. There is a small subcentimeter hyperdense nodule in the  right hepatic lobe favored to represent a flash hemangioma. Musculoskeletal: No chest wall abnormality. No bony spinal canal stenosis. Review of the MIP images confirms the above findings. IMPRESSION: 1. No evidence of pulmonary embolus. 2. Diffuse bilateral ground-glass airspace opacities, consistent with the patient's history of viral pneumonia. Electronically Signed   By: Katherine Mantle M.D.   On: 06/02/2020 19:14        Scheduled Meds: . vitamin C  1,000 mg Oral TID  . [START ON 06/04/2020] azithromycin  500 mg Oral Once  . [START ON 06/04/2020] cefdinir  300 mg Oral Q12H  . cholecalciferol  400 Units Oral Daily  . dextromethorphan-guaiFENesin  1 tablet Oral BID  . enoxaparin (LOVENOX) injection  40 mg Subcutaneous Q24H  . famotidine  20 mg Oral BID  . melatonin  5 mg Oral QHS  . methylPREDNISolone (SOLU-MEDROL) injection  40 mg Intravenous Q12H  . sodium chloride flush  3 mL Intravenous Q12H  . zinc sulfate  220 mg Oral Daily   Continuous Infusions: . sodium chloride 500 mL (06/03/20 0837)  . sodium chloride 250 mL (06/02/20 0914)  . 0.9 % NaCl with KCl 20 mEq / L 50 mL/hr at 06/02/20 0610  . remdesivir 100 mg in NS 100 mL 100 mg (06/03/20 1017)     LOS: 4 days    Gertha Calkin, MD [facility] Pager [phone] If 7PM-7AM, please contact night-coverage [email or web address] Password [email or web address] 06/03/2020, 1:26 PM

## 2020-06-03 NOTE — Evaluation (Signed)
Physical Therapy Evaluation Patient Details Name: Karina Swanson MRN: [id number] DOB: 03-05-1961 Today's Date: 06/03/2020   History of Present Illness  presented to ER secondary to AMS, fever, cough and decreased PO intake; admitted for management of acute metabolic encephalopathy and acute/chronic respiratory failure due to COVID-19.  Clinical Impression  Upon evaluation, patient alert and oriented; follows commands and demonstrates good effort with mobility tasks.  Bilat UE/LE strength and ROM grossly symmetrical and WFL; no focal weakness appreciated.  Able to complete bed mobility indep; sit/stand, basic transfers and gait (100' x1, 75' x1) without assist device, sup.  Demonstrates reciprocal stepping pattern, fair step height/length; slightly guarded trunk/UE posturing, but no overt buckling, LOB or safety concern.  Does desat to 86% on RA, requiring 2L for recovery and to maintain O2 >88%.  Do anticipate need for O2 upon discharge; RN/MD informed and aware. Would benefit from skilled PT to address above deficits and promote optimal return to PLOF.; Recommend transition to HHPT upon discharge from acute hospitalization.   SaO2 on room air at rest = 90% SaO2 on room air while ambulating = 86% SaO2 on 2 liters of O2 while ambulating = 89-90%     Follow Up Recommendations Home health PT    Equipment Recommendations       Recommendations for Other Services       Precautions / Restrictions Precautions Precautions: Fall Restrictions Weight Bearing Restrictions: No      Mobility  Bed Mobility Overal bed mobility: Independent                Transfers Overall transfer level: Needs assistance Equipment used: None Transfers: Sit to/from Stand Sit to Stand: Supervision         General transfer comment: fair/good LE strength with movement transitions; limited use of bilat UEs  Ambulation/Gait Ambulation/Gait assistance: Supervision Gait Distance (Feet): 100  Feet Assistive device: None       General Gait Details: reciprocal stepping pattern, fair step height/length; slightly guarded trunk/UE posturing, but no overt buckling, LOB or safety concern.  Does desat to 86% on RA, requiring 2L for recovery and to maintain O2 >88%.  Stairs            Wheelchair Mobility    Modified Rankin (Stroke Patients Only)       Balance Overall balance assessment: Needs assistance Sitting-balance support: Feet supported;No upper extremity supported Sitting balance-Leahy Scale: Good     Standing balance support: No upper extremity supported Standing balance-Leahy Scale: Good                               Pertinent Vitals/Pain Pain Assessment: No/denies pain    Home Living Family/patient expects to be discharged to:: Private residence Living Arrangements: Spouse/significant other;Children Available Help at Discharge: Family Type of Home: House Home Access: Stairs to enter   Secretary/administrator of Steps: 4 Home Layout: Two level Home Equipment: None      Prior Function Level of Independence: Independent         Comments: Indep with ADLs, household and community mobilization without assist device; no home O2; denies fall history. 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 [employer]        Extremity/Trunk Assessment   Upper Extremity Assessment Upper Extremity Assessment: Overall WFL for tasks assessed    Lower Extremity Assessment Lower Extremity Assessment: Overall WFL for tasks assessed  Communication   Communication: No difficulties  Cognition Arousal/Alertness: Awake/alert Behavior During Therapy: WFL for tasks assessed/performed Overall Cognitive Status: Within Functional Limits for tasks assessed                                        General Comments      Exercises Other Exercises Other Exercises: 72' without assist device, sup-reciprocal  stepping pattern with fair step height/length; slightly guarded, but good overall safety awareness with gait.  Sats 88-89% on 2L with exertion; anticipate need for home O2 upon discharge. Other Exercises: Toilet transfer, ambulatory without assist device, sup; negotiates turns, obstacles without buckling, LOB.  Sit/stand from standard height toilet, close sup; standing balance at sink for hand hygiene, clsoe sup.  good awareness of limits of stability and overall safety needs. Other Exercises: Assisted to prone position per patient request end of session; positioned to comfort, needs in reach.  RN informed/aware of position and performance during session.   Assessment/Plan    PT Assessment Patient needs continued PT services  PT Problem List Decreased activity tolerance;Decreased balance;Decreased mobility;Cardiopulmonary status limiting activity       PT Treatment Interventions DME instruction;Gait training;Stair training;Functional mobility training;Therapeutic activities;Therapeutic exercise;Balance training;Cognitive remediation;Patient/family education    PT Goals (Current goals can be found in the Care Plan section)  Acute Rehab PT Goals Patient Stated Goal: to return home PT Goal Formulation: With patient Time For Goal Achievement: 06/17/20 Potential to Achieve Goals: Good    Frequency Min 2X/week   Barriers to discharge        Co-evaluation               AM-PAC PT "6 Clicks" Mobility  Outcome Measure Help needed turning from your back to your side while in a flat bed without using bedrails?: None Help needed moving from lying on your back to sitting on the side of a flat bed without using bedrails?: None Help needed moving to and from a bed to a chair (including a wheelchair)?: None Help needed standing up from a chair using your arms (e.g., wheelchair or bedside chair)?: None Help needed to walk in hospital room?: None Help needed climbing 3-5 steps with a railing? :  A Little 6 Click Score: 23    End of Session Equipment Utilized During Treatment: Gait belt Activity Tolerance: Patient tolerated treatment well Patient left: in bed;with call bell/phone within reach;with bed alarm set Nurse Communication: Mobility status PT Visit Diagnosis: Muscle weakness (generalized) (M62.81)    Time: 2595-6387 PT Time Calculation (min) (ACUTE ONLY): 37 min   Charges:   PT Evaluation $PT Eval Moderate Complexity: 1 Mod PT Treatments $Therapeutic Activity: 23-37 mins       Jeson Camacho H. Manson Passey, PT, DPT, NCS 06/03/20, 10:16 AM [phone]

## 2020-06-03 NOTE — Progress Notes (Addendum)
SATURATION QUALIFICATIONS: (This note is used to comply with regulatory documentation for home oxygen)  Patient Saturations on Room Air at Rest = 90%  Patient Saturations on Room Air while Ambulating = 86%  Patient Saturations on 3 Liters of oxygen while Ambulating = 90

## 2020-06-03 NOTE — Progress Notes (Signed)
[facility]  ROUNDING NOTE   Subjective:   Na 133.  Objective:  Vital signs in last 24 hours:  Temp:  [97.6 F (36.4 C)-98.8 F (37.1 C)] 98.8 F (37.1 C) (09/24 0400) Pulse Rate:  [72-95] 72 (09/24 0400) Resp:  [20-27] 22 (09/24 0400) BP: (103-124)/(61-81) 109/67 (09/24 0400) SpO2:  [92 %-94 %] 94 % (09/24 0400) Weight:  [56.1 kg] 56.1 kg (09/24 0400)  Weight change: 1.678 kg Filed Weights   06/01/20 1730 06/02/20 0416 06/03/20 0400  Weight: 54.4 kg 54.4 kg 56.1 kg    Intake/Output: I/O last 3 completed shifts: In: 447.2 [I.V.:447.2] Out: 50 [Urine:50]   Intake/Output this shift:  Total I/O In: 240 [P.O.:240] Out: -   Physical Exam: General: No acute distress, appears slightly lethargic  Head: Normocephalic, atraumatic. Moist oral mucosal membranes  Eyes: Anicteric  Neck: Supple, trachea midline  Lungs:  Normal and symmetrical respiratory effort, Lungs clear  Heart: S1S2 +Regular rate and rhythm  Abdomen:  Soft, nontender  Extremities:  no peripheral edema.  Neurologic: Alert, awake, answers questions appropriately  Skin: No acute rashes or lesions        Basic Metabolic Panel: Recent Labs  Lab 05/31/20 0357 05/31/20 0848 06/01/20 0625 06/01/20 0901 06/01/20 2300 06/01/20 2300 06/02/20 0343 06/02/20 0343 06/02/20 0843 06/02/20 1423 06/02/20 2030 06/03/20 0604 06/03/20 0907  NA 115*   < > 123*   < > 127*   < > 131*   < > 128* 128* 127* 132* 133*  K 3.9  --  3.1*  --  3.3*  --  3.3*  --   --   --   --  3.3*  --   CL 80*  --  86*  --  91*  --  95*  --   --   --   --  97*  --   CO2 23  --  24  --  25  --  26  --   --   --   --  25  --   GLUCOSE 110*  --  97  --  100*  --  90  --   --   --   --  130*  --   BUN 10  --  10  --  8  --  7  --   --   --   --  5*  --   CREATININE 0.62  --  0.64  --  0.52  --  0.51  --   --   --   --  0.48  --   CALCIUM 8.5*  --  8.6*   < > 8.4*  --  8.1*  --   --   --   --  8.3*  --   MG  --   --   --   --    --   --  1.9  --   --   --   --   --   --   PHOS  --   --   --   --   --   --  2.1*  --   --   --   --   --   --    < > = values in this interval not displayed.    Liver Function Tests: Recent Labs  Lab 05/30/20 1156 05/31/20 0357 06/01/20 0625 06/02/20 0343 06/03/20 0604  AST 86* 141* 113* 65* 44*  ALT 51* 93* 87*  65* 52*  ALKPHOS 88 85 85 74 71  BILITOT 0.7 0.8 0.8 0.8 0.5  PROT 7.1 7.0 6.9 6.2* 6.0*  ALBUMIN 3.7 3.4* 3.3* 2.9* 2.7*   No results for input(s): LIPASE, AMYLASE in the last 168 hours. No results for input(s): AMMONIA in the last 168 hours.  CBC: Recent Labs  Lab 05/30/20 1156 05/31/20 0357 06/01/20 0625 06/02/20 0343  WBC 10.2 12.1* 9.3 10.3  NEUTROABS 8.6* 9.7* 7.4 8.1*  HGB 12.8 13.0 13.0 11.9*  HCT 34.5* 35.8* 36.5 34.3*  MCV 77.9* 78.3* 80.0 81.7  PLT 255 295 311 323    Cardiac Enzymes: No results for input(s): CKTOTAL, CKMB, CKMBINDEX, TROPONINI in the last 168 hours.  BNP: Invalid input(s): POCBNP  CBG: Recent Labs  Lab 05/30/20 1228 06/01/20 2014  GLUCAP 149* 106*    Microbiology: Results for orders placed or performed during the hospital encounter of 05/30/20  Culture, Urine     Status: Abnormal   Collection Time: 05/30/20 11:58 AM   Specimen: Urine, Random  Result Value Ref Range Status   Specimen Description   Final    URINE, RANDOM Performed at [facility], [address] [place], [place] [address]    Special Requests   Final    NONE Performed at [facility], [address] [place], [place] [address]    Culture MULTIPLE SPECIES PRESENT, SUGGEST RECOLLECTION (A)  Final   Report Status 05/31/2020 FINAL  Final  SARS Coronavirus 2 by RT PCR (hospital order, performed in [facility] hospital lab) Nasopharyngeal Nasopharyngeal Swab     Status: Abnormal   Collection Time: 05/30/20  1:23 PM   Specimen: Nasopharyngeal Swab  Result Value Ref Range Status   SARS Coronavirus 2 POSITIVE (A) NEGATIVE Final     Comment: RESULT CALLED TO, READ BACK BY AND VERIFIED WITH: ANNA JASPER 05/30/20 AT 1450 BY ACR (NOTE) SARS-CoV-2 target nucleic acids are DETECTED  SARS-CoV-2 RNA is generally detectable in upper respiratory specimens  during the acute phase of infection.  Positive results are indicative  of the presence of the identified virus, but do not rule out bacterial infection or co-infection with other pathogens not detected by the test.  Clinical correlation with patient history and  other diagnostic information is necessary to determine patient infection status.  The expected result is negative.  Fact Sheet for Patients:   BoilerBrush.com.cy   Fact Sheet for Healthcare Providers:   https://pope.com/    This test is not yet approved or cleared by the [place] FDA and  has been authorized for detection and/or diagnosis of SARS-CoV-2 by FDA under an Emergency Use Authorization (EUA).  This EUA will remain in effect (meaning this te st can be used) for the duration of  the COVID-19 declaration under Section 564(b)(1) of the Act, 21 U.S.C. section 360-bbb-3(b)(1), unless the authorization is terminated or revoked sooner.  Performed at [facility], [address] [place], [place] [address]     Coagulation Studies: No results for input(s): LABPROT, INR in the last 72 hours.  Urinalysis: No results for input(s): COLORURINE, LABSPEC, PHURINE, GLUCOSEU, HGBUR, BILIRUBINUR, KETONESUR, PROTEINUR, UROBILINOGEN, NITRITE, LEUKOCYTESUR in the last 72 hours.  Invalid input(s): APPERANCEUR    Imaging: DG Chest 1 View  Result Date: 06/02/2020 CLINICAL DATA:  Acute respiratory failure.  COVID. EXAM: CHEST  1 VIEW COMPARISON:  05/31/2020. FINDINGS: Mediastinum and hilar structures normal. Heart size normal. Diffuse bilateral interstitial prominence. Pneumonitis could present this fashion. No pleural effusion  or pneumothorax. IMPRESSION:  Diffuse bilateral interstitial prominence consistent with pneumonitis. Similar findings noted on prior exam. Electronically Signed   By: Maisie Fus  Register   On: 06/02/2020 13:58   CT ANGIO CHEST PE W OR WO CONTRAST  Result Date: 06/02/2020 CLINICAL DATA:  Respiratory failure EXAM: CT ANGIOGRAPHY CHEST WITH CONTRAST TECHNIQUE: Multidetector CT imaging of the chest was performed using the standard protocol during bolus administration of intravenous contrast. Multiplanar CT image reconstructions and MIPs were obtained to evaluate the vascular anatomy. CONTRAST:  70mL OMNIPAQUE IOHEXOL 350 MG/ML SOLN COMPARISON:  None. FINDINGS: Cardiovascular: Contrast injection is sufficient to demonstrate satisfactory opacification of the pulmonary arteries to the segmental level. There is no pulmonary embolus or evidence of right heart strain. The size of the main pulmonary artery is normal. Heart size is normal, with no pericardial effusion. The course and caliber of the aorta are normal. There is no atherosclerotic calcification. Opacification decreased due to pulmonary arterial phase contrast bolus timing. Mediastinum/Nodes: -- No mediastinal lymphadenopathy. -- No hilar lymphadenopathy. -- No axillary lymphadenopathy. -- No supraclavicular lymphadenopathy. -- Normal thyroid gland where visualized. -  Unremarkable esophagus. Lungs/Pleura: There are diffuse bilateral ground-glass airspace opacities. There is no pneumothorax. No significant pleural effusion. There is atelectasis at the lung bases. Upper Abdomen: Contrast bolus timing is not optimized for evaluation of the abdominal organs. There is a small subcentimeter hyperdense nodule in the right hepatic lobe favored to represent a flash hemangioma. Musculoskeletal: No chest wall abnormality. No bony spinal canal stenosis. Review of the MIP images confirms the above findings. IMPRESSION: 1. No evidence of pulmonary embolus. 2. Diffuse bilateral ground-glass airspace  opacities, consistent with the patient's history of viral pneumonia. Electronically Signed   By: Katherine Mantle M.D.   On: 06/02/2020 19:14     Medications:   . sodium chloride 500 mL (06/03/20 0837)  . sodium chloride 250 mL (06/02/20 0914)  . 0.9 % NaCl with KCl 20 mEq / L 50 mL/hr at 06/02/20 0610  . azithromycin 500 mg (06/03/20 1127)  . cefTRIAXone (ROCEPHIN)  IV 1 g (06/03/20 1191)  . famotidine (PEPCID) IV 20 mg (06/02/20 2049)  . remdesivir 100 mg in NS 100 mL 100 mg (06/03/20 1017)   . vitamin C  1,000 mg Oral TID  . cholecalciferol  400 Units Oral Daily  . dextromethorphan-guaiFENesin  1 tablet Oral BID  . enoxaparin (LOVENOX) injection  40 mg Subcutaneous Q24H  . melatonin  5 mg Oral QHS  . methylPREDNISolone (SOLU-MEDROL) injection  40 mg Intravenous Q12H  . potassium chloride  20 mEq Oral Once  . sodium chloride flush  3 mL Intravenous Q12H  . zinc sulfate  220 mg Oral Daily   sodium chloride, sodium chloride, acetaminophen, docusate sodium, ondansetron (ZOFRAN) IV, polyethylene glycol, sodium chloride flush  Assessment/ Plan:  Karina Swanson is a 59 y.o. white female with no significant medical history , who was admitted to [facility] on 05/30/2020 for Covid+, dehydrated, EMS  1. Hyponatremia consistent with hypovolemic hyponatremia Treated with hypertonic saline Encourage oral intake IVF on flow NS with 20 meq Potassium  2. Hypokalemia K+3.3  IVF with Potassium    LOS: 4 Gricelda Foland 9/24/202111:48 AM

## 2020-06-04 LAB — LACTATE DEHYDROGENASE: LDH: 220 U/L — ABNORMAL HIGH (ref 98–192)

## 2020-06-04 LAB — COMPREHENSIVE METABOLIC PANEL
ALT: 49 U/L — ABNORMAL HIGH (ref 0–44)
AST: 34 U/L (ref 15–41)
Albumin: 2.7 g/dL — ABNORMAL LOW (ref 3.5–5.0)
Alkaline Phosphatase: 62 U/L (ref 38–126)
Anion gap: 11 (ref 5–15)
BUN: 12 mg/dL (ref 6–20)
CO2: 26 mmol/L (ref 22–32)
Calcium: 8.6 mg/dL — ABNORMAL LOW (ref 8.9–10.3)
Chloride: 98 mmol/L (ref 98–111)
Creatinine, Ser: 0.46 mg/dL (ref 0.44–1.00)
GFR calc Af Amer: >60 mL/min (ref >60)
GFR calc non Af Amer: >60 mL/min (ref >60)
Glucose, Bld: 125 mg/dL — ABNORMAL HIGH (ref 70–99)
Potassium: 3.2 mmol/L — ABNORMAL LOW (ref 3.5–5.1)
Sodium: 135 mmol/L (ref 135–145)
Total Bilirubin: 0.5 mg/dL (ref 0.3–1.2)
Total Protein: 5.9 g/dL — ABNORMAL LOW (ref 6.5–8.1)

## 2020-06-04 LAB — FERRITIN: Ferritin: 808 ng/mL — ABNORMAL HIGH (ref 11–307)

## 2020-06-04 LAB — CBC
HCT: 33.5 % — ABNORMAL LOW (ref 36.0–46.0)
Hemoglobin: 11.5 g/dL — ABNORMAL LOW (ref 12.0–15.0)
MCH: 28.1 pg (ref 26.0–34.0)
MCHC: 34.3 g/dL (ref 30.0–36.0)
MCV: 81.9 fL (ref 80.0–100.0)
Platelets: 451 10*3/uL — ABNORMAL HIGH (ref 150–400)
RBC: 4.09 MIL/uL (ref 3.87–5.11)
RDW: 12.5 % (ref 11.5–15.5)
WBC: 12.6 10*3/uL — ABNORMAL HIGH (ref 4.0–10.5)
nRBC: 0 % (ref 0.0–0.2)

## 2020-06-04 LAB — PROCALCITONIN: Procalcitonin: <0.1 ng/mL

## 2020-06-04 LAB — FIBRIN DERIVATIVES D-DIMER (ARMC ONLY): Fibrin derivatives D-dimer (ARMC): 1261.35 ng/mL (FEU) — ABNORMAL HIGH (ref 0.00–499.00)

## 2020-06-04 LAB — C-REACTIVE PROTEIN: CRP: 2 mg/dL — ABNORMAL HIGH (ref <1.0)

## 2020-06-04 LAB — SODIUM
Sodium: 135 mmol/L (ref 135–145)
Sodium: 134 mmol/L — ABNORMAL LOW (ref 135–145)

## 2020-06-04 MED ORDER — POTASSIUM CHLORIDE 20 MEQ PO PACK
40.0000 meq | PACK | Freq: Every day | ORAL | Status: AC
  Administered 2020-06-04 – 2020-06-05 (×2): 40 meq via ORAL
  Filled 2020-06-04 (×2): qty 2

## 2020-06-04 NOTE — Progress Notes (Signed)
PROGRESS NOTE    Karina Swanson  VHQ:[id number] DOB: 02/19/1961 DOA: 05/30/2020 PCP: Johny BlamerHarris, William, MD   Brief Narrative:  59 y.o. Female admitted with Acute Metabolic Encephalopathy in the setting of Hypotonic Hypovolemic Hyponatremia requiring Hypertonic Saline.  Also found to have COVID-19, but pt without hypoxia or respiratory symptoms, and has normal CXR. Nephrology consulted and managing pt's severe hyponatremia with mental status changes. Level has been steadily Improving.  Per notes, she has been having about a 2 week history of fever/chills, cough.  She has a COVID test performed last week on 05/26/2020 by her PCP, but results are not available.  She was reported to be lethargic and confused with poor PO intake since last night, therefore her family brought her to ED.  Upon presentation to the ED, her vital signs are within normal limits (RR 16, HR 85, BP 119/69, 100% SpO2 on room air), and no signs of respiratory symptoms.  Initial workup revealed serum Na 110, potassium 2.9, BUN <5, Creatinine < 0.35, glucose 143, AST 86, ALT 51, WBC 10.2 with Neutrophilia, procalcitonin 0.18, serum osmolality 237, urine osmolality 457, and Urine Na 19.  Her COVID-19 PCR is positive.  Her CXR is normal.  Urinalysis with trace leukocytes.  Nephrology was consulted who recommended placing on 3% Hypertonic Saline infusion at 20 cc/hr.  PCCM is asked to admit the pt to ICU for further workup and treatment of Acute Metabolic Encephalopathy in the setting of Hypotonic Hypovolemic Hyponatremia requiring 3% Hypertonic Saline.   Blood pressure 131/78, pulse 91, temperature 97.8 F (36.6 C), temperature source Oral, resp. rate 16, height 5' (1.524 m), weight 59.5 kg, SpO2 96 %.   Physical Exam Vitals reviewed.  Constitutional:      Appearance: Normal appearance. She is normal weight.  HENT:     Head: Normocephalic and atraumatic.     Right Ear: External ear normal.     Left Ear: External ear  normal.  Eyes:     Extraocular Movements: Extraocular movements intact.  Cardiovascular:     Rate and Rhythm: Normal rate and regular rhythm.     Pulses: Normal pulses.     Heart sounds: Normal heart sounds.  Pulmonary:     Effort: Pulmonary effort is normal.     Breath sounds: Normal breath sounds.  Abdominal:     Palpations: Abdomen is soft.  Skin:    General: Skin is warm.  Neurological:     General: No focal deficit present.     Mental Status: She is alert and oriented to person, place, and time. Mental status is at baseline.  Psychiatric:        Mood and Affect: Mood normal.    Assessment & Plan:   Principal Problem:   Acute hypoxemic respiratory failure due to COVID-19 [facility](HCC) Active Problems:   Hyponatremia  A/C hypoxic respiratory failure due to covid-19 : -Pt has started to require oxygen 2L [address] [place] to keep her o2 sats above 90%. Her oxygen saturation dropped today to 88% and was started on 2L [place]. -prone position for improved oxygenation.  -pt started on solumedrol after getting permission from son [name].  -family hesitant towards remdesivir and per Dr.kasa's note Baricitinab when pt admitted due to side effect panel. -yesterday we did cta chest which was negative for pe and pt has responded well to remdesivir and solumedrol. Pt has been proning and have called son and d/w him good new and plan for discharge. Family does not want mom to be  discharged until weekend Is over.   COVID-19 Labs  Recent Labs    06/01/20 2300 06/02/20 0343 06/03/20 0604 06/04/20 0508  FERRITIN  --  1,036* 786* 808*  LDH  --   --  254* 220*  CRP 6.7* 5.4*  --  2.0*    Lab Results  Component Value Date   SARSCOV2NAA POSITIVE (A) 05/30/2020   SpO2: 96 % O2 Flow Rate (L/min): 3 L/min   COVID-19 Labs  Recent Labs    06/01/20 2300 06/02/20 0343 06/03/20 0604 06/04/20 0508  FERRITIN  --  1,036* 786* 808*  LDH  --   --  254* 220*  CRP 6.7* 5.4*  --  2.0*    Lab Results    Component Value Date   SARSCOV2NAA POSITIVE (A) 05/30/2020  SpO2: 96 % O2 Flow Rate (L/min): 3 L/min  Hyponatremia: continue ivf at 50 cc hour. Serum sodium check. Pt is stable neurologically and anticipate d/c tomorrow to home PT consult appreciated. Cont ivf.resolved.   Hypokalemia: Replaced and will follow. Po replacement.   Transamnitis : Present for admission is improving. Improving.   DVT prophylaxis: lovenox. Code Status: Full Family Communication: johhny Disposition Plan: home with PT.  Status is: Inpatient  Dispo: The patient is from: Home              Anticipated d/c is to: Home              Anticipated d/c date is: > 3 days              Patient currently is not medically stable to d/c. Consultants:   Nephrology   Antimicrobials:  Anti-infectives (From admission, onward)   Start     Dose/Rate Route Frequency Ordered Stop   06/04/20 1000  azithromycin (ZITHROMAX) tablet 500 mg        500 mg Oral  Once 06/03/20 1216 06/04/20 1114   06/04/20 1000  cefdinir (OMNICEF) capsule 300 mg        300 mg Oral Every 12 hours 06/03/20 1216 06/05/20 0959   06/03/20 1000  remdesivir 100 mg in sodium chloride 0.9 % 100 mL IVPB       "Followed by" Linked Group Details   100 mg 200 mL/hr over 30 Minutes Intravenous Daily 06/02/20 2109 06/07/20 0959   06/02/20 2200  remdesivir 200 mg in sodium chloride 0.9% 250 mL IVPB       "Followed by" Linked Group Details   200 mg 580 mL/hr over 30 Minutes Intravenous Once 06/02/20 2109 06/02/20 2229   05/31/20 0800  azithromycin (ZITHROMAX) 500 mg in sodium chloride 0.9 % 250 mL IVPB  Status:  Discontinued        500 mg 250 mL/hr over 60 Minutes Intravenous Every 24 hours 05/31/20 0720 06/03/20 1216   05/31/20 0800  cefTRIAXone (ROCEPHIN) 1 g in sodium chloride 0.9 % 100 mL IVPB  Status:  Discontinued        1 g 200 mL/hr over 30 Minutes Intravenous Every 24 hours 05/31/20 0720 06/03/20 1216       Subjective: Pt is alert and  awake and is stable but weak from her Gastroenteritis attributed to covid-19 infection. PT has been asymptomatic and came for hyponatremia and was initially treated with hypertonic saline which has been d./c and pt is on ns.   9/23 Pt is stable on 2L [place] and CRP is improving and we will follow.  CTA chest to rule out PE and any bacterial infection.  9/24 Pt is alert awake and oriented and denies any complaints is proning and labs are improving.   9/25 Pt seen today and waking make her hypoxic and dizzy so she loses her balance.  I have walked with pt around nurses station and she was not dyspneic or sob.  Objective: Vitals:   06/03/20 2034 06/04/20 0413 06/04/20 0500 06/04/20 0826  BP: 132/81 112/73  131/78  Pulse: 95 88  91  Resp:  16    Temp: (!) 97.5 F (36.4 C) 98.4 F (36.9 C)  97.8 F (36.6 C)  TempSrc: Oral Oral  Oral  SpO2: 96% 96%  96%  Weight:   59.5 kg   Height:        Intake/Output Summary (Last 24 hours) at 06/04/2020 1453 Last data filed at 06/03/2020 2200 Gross per 24 hour  Intake 1623.87 ml  Output 0 ml  Net 1623.87 ml   Filed Weights   06/02/20 0416 06/03/20 0400 06/04/20 0500  Weight: 54.4 kg 56.1 kg 59.5 kg    Examination: Blood pressure 131/78, pulse 91, temperature 97.8 F (36.6 C), temperature source Oral, resp. rate 16, height 5' (1.524 m), weight 59.5 kg, SpO2 96 %. General exam: Appears calm and comfortable  Respiratory system: Clear to auscultation. Respiratory effort normal. Cardiovascular system: RRR. No JVD, murmurs, rubs, gallops or clicks. No pedal edema. Gastrointestinal system: Abdomen is nondistended, soft and nontender. No organomegaly or masses felt. Normal bowel sounds heard. Central nervous system: Alert and oriented. No focal neurological deficits. Extremities: Symmetric 5 x 5 power. Skin: No rashes, lesions or ulcers Psychiatry: Judgement and insight appear normal. Mood & affect appropriate.   Data Reviewed: I have personally  reviewed following labs and imaging studies  I/O last 3 completed shifts: In: 1863.9 [P.O.:480; I.V.:1283.9; IV Piggyback:100] Out: 0  No intake/output data recorded. Lab Results  Component Value Date   CREATININE 0.46 06/04/2020   CREATININE 0.48 06/03/2020   CREATININE 0.51 06/02/2020   CBC: Recent Labs  Lab 05/30/20 1156 05/31/20 0357 06/01/20 0625 06/02/20 0343 06/04/20 1052  WBC 10.2 12.1* 9.3 10.3 12.6*  NEUTROABS 8.6* 9.7* 7.4 8.1*  --   HGB 12.8 13.0 13.0 11.9* 11.5*  HCT 34.5* 35.8* 36.5 34.3* 33.5*  MCV 77.9* 78.3* 80.0 81.7 81.9  PLT 255 295 311 323 451*   Basic Metabolic Panel: Recent Labs  Lab 06/01/20 0625 06/01/20 0901 06/01/20 2300 06/01/20 2300 06/02/20 0343 06/02/20 0843 06/03/20 0604 06/03/20 0907 06/03/20 2053 06/04/20 0508 06/04/20 0856  NA 123*   < > 127*   < > 131*   < > 132* 133* 135 135 135  K 3.1*  --  3.3*  --  3.3*  --  3.3*  --   --  3.2*  --   CL 86*  --  91*  --  95*  --  97*  --   --  98  --   CO2 24  --  25  --  26  --  25  --   --  26  --   GLUCOSE 97  --  100*  --  90  --  130*  --   --  125*  --   BUN 10  --  8  --  7  --  5*  --   --  12  --   CREATININE 0.64  --  0.52  --  0.51  --  0.48  --   --  0.46  --  CALCIUM 8.6*  --  8.4*  --  8.1*  --  8.3*  --   --  8.6*  --   MG  --   --   --   --  1.9  --   --   --   --   --   --   PHOS  --   --   --   --  2.1*  --   --   --   --   --   --    < > = values in this interval not displayed.   GFR: Estimated Creatinine Clearance: 61.1 mL/min (by C-G formula based on SCr of 0.46 mg/dL). Liver Function Tests: Recent Labs  Lab 05/31/20 0357 06/01/20 0625 06/02/20 0343 06/03/20 0604 06/04/20 0508  AST 141* 113* 65* 44* 34  ALT 93* 87* 65* 52* 49*  ALKPHOS 85 85 74 71 62  BILITOT 0.8 0.8 0.8 0.5 0.5  PROT 7.0 6.9 6.2* 6.0* 5.9*  ALBUMIN 3.4* 3.3* 2.9* 2.7* 2.7*   CBG: Recent Labs  Lab 05/30/20 1228 06/01/20 2014  GLUCAP 149* 106*   Anemia Panel: Recent Labs     06/03/20 0604 06/04/20 0508  FERRITIN 786* 808*   Sepsis Labs: Recent Labs  Lab 06/01/20 0625 06/02/20 0843 06/03/20 0604 06/04/20 0508  PROCALCITON 0.17 <0.10 <0.10 <0.10    Recent Results (from the past 240 hour(s))  Culture, Urine     Status: Abnormal   Collection Time: 05/30/20 11:58 AM   Specimen: Urine, Random  Result Value Ref Range Status   Specimen Description   Final    URINE, RANDOM Performed at [facility], [address], [place], [place] [address]    Special Requests   Final    NONE Performed at [facility], [address] [place], [place] [address]    Culture MULTIPLE SPECIES PRESENT, SUGGEST RECOLLECTION (A)  Final   Report Status 05/31/2020 FINAL  Final  SARS Coronavirus 2 by RT PCR (hospital order, performed in [facility] hospital lab) Nasopharyngeal Nasopharyngeal Swab     Status: Abnormal   Collection Time: 05/30/20  1:23 PM   Specimen: Nasopharyngeal Swab  Result Value Ref Range Status   SARS Coronavirus 2 POSITIVE (A) NEGATIVE Final    Comment: RESULT CALLED TO, READ BACK BY AND VERIFIED WITH: ANNA JASPER 05/30/20 AT 1450 BY ACR (NOTE) SARS-CoV-2 target nucleic acids are DETECTED  SARS-CoV-2 RNA is generally detectable in upper respiratory specimens  during the acute phase of infection.  Positive results are indicative  of the presence of the identified virus, but do not rule out bacterial infection or co-infection with other pathogens not detected by the test.  Clinical correlation with patient history and  other diagnostic information is necessary to determine patient infection status.  The expected result is negative.  Fact Sheet for Patients:   BoilerBrush.com.cy   Fact Sheet for Healthcare Providers:   https://pope.com/    This test is not yet approved or cleared by the [place] FDA and  has been authorized for detection and/or diagnosis of SARS-CoV-2 by FDA  under an Emergency Use Authorization (EUA).  This EUA will remain in effect (meaning this te st can be used) for the duration of  the COVID-19 declaration under Section 564(b)(1) of the Act, 21 U.S.C. section 360-bbb-3(b)(1), unless the authorization is terminated or revoked sooner.  Performed at [facility], [address] [place], [place] [address]  Radiology Studies: CT ANGIO CHEST PE W OR WO CONTRAST  Result Date: 06/02/2020 CLINICAL DATA:  Respiratory failure EXAM: CT ANGIOGRAPHY CHEST WITH CONTRAST TECHNIQUE: Multidetector CT imaging of the chest was performed using the standard protocol during bolus administration of intravenous contrast. Multiplanar CT image reconstructions and MIPs were obtained to evaluate the vascular anatomy. CONTRAST:  28mL OMNIPAQUE IOHEXOL 350 MG/ML SOLN COMPARISON:  None. FINDINGS: Cardiovascular: Contrast injection is sufficient to demonstrate satisfactory opacification of the pulmonary arteries to the segmental level. There is no pulmonary embolus or evidence of right heart strain. The size of the main pulmonary artery is normal. Heart size is normal, with no pericardial effusion. The course and caliber of the aorta are normal. There is no atherosclerotic calcification. Opacification decreased due to pulmonary arterial phase contrast bolus timing. Mediastinum/Nodes: -- No mediastinal lymphadenopathy. -- No hilar lymphadenopathy. -- No axillary lymphadenopathy. -- No supraclavicular lymphadenopathy. -- Normal thyroid gland where visualized. -  Unremarkable esophagus. Lungs/Pleura: There are diffuse bilateral ground-glass airspace opacities. There is no pneumothorax. No significant pleural effusion. There is atelectasis at the lung bases. Upper Abdomen: Contrast bolus timing is not optimized for evaluation of the abdominal organs. There is a small subcentimeter hyperdense nodule in the right hepatic lobe favored to represent a flash hemangioma.  Musculoskeletal: No chest wall abnormality. No bony spinal canal stenosis. Review of the MIP images confirms the above findings. IMPRESSION: 1. No evidence of pulmonary embolus. 2. Diffuse bilateral ground-glass airspace opacities, consistent with the patient's history of viral pneumonia. Electronically Signed   By: Katherine Mantle M.D.   On: 06/02/2020 19:14        Scheduled Meds: . vitamin C  1,000 mg Oral TID  . cefdinir  300 mg Oral Q12H  . cholecalciferol  400 Units Oral Daily  . dextromethorphan-guaiFENesin  1 tablet Oral BID  . enoxaparin (LOVENOX) injection  40 mg Subcutaneous Q24H  . famotidine  20 mg Oral BID  . melatonin  5 mg Oral QHS  . methylPREDNISolone (SOLU-MEDROL) injection  40 mg Intravenous Q12H  . potassium chloride  40 mEq Oral Daily  . sodium chloride flush  3 mL Intravenous Q12H  . zinc sulfate  220 mg Oral Daily   Continuous Infusions: . sodium chloride Stopped (06/03/20 1521)  . sodium chloride Stopped (06/02/20 2357)  . 0.9 % NaCl with KCl 20 mEq / L 50 mL/hr at 06/04/20 0076  . remdesivir 100 mg in NS 100 mL 100 mg (06/04/20 1117)     LOS: 5 days    Gertha Calkin, MD [facility] Pager [phone] If 7PM-7AM, please contact night-coverage [email or web address] Password [facility] 06/04/2020, 2:53 PM

## 2020-06-04 NOTE — Progress Notes (Signed)
Karina Swanson  MRN: [id number]  DOB/AGE: 59-19-1962 59 y.o.  Primary Care Physician:Harris, Chrissie Noa, MD  Admit date: 05/30/2020  Chief Complaint:  Chief Complaint  Patient presents with   Altered Mental Status    S-Pt presented on  05/30/2020 with  Chief Complaint  Patient presents with   Altered Mental Status  . Patient offers no new complaints  Medications  vitamin C  1,000 mg Oral TID   cefdinir  300 mg Oral Q12H   cholecalciferol  400 Units Oral Daily   dextromethorphan-guaiFENesin  1 tablet Oral BID   enoxaparin (LOVENOX) injection  40 mg Subcutaneous Q24H   famotidine  20 mg Oral BID   melatonin  5 mg Oral QHS   methylPREDNISolone (SOLU-MEDROL) injection  40 mg Intravenous Q12H   potassium chloride  40 mEq Oral Daily   sodium chloride flush  3 mL Intravenous Q12H   zinc sulfate  220 mg Oral Daily         VZS:MOLMB from the symptoms mentioned above,there are no other symptoms referable to all systems reviewed.  Physical Exam: Vital signs in last 24 hours: Temp:  [97.5 F (36.4 C)-98.4 F (36.9 C)] 97.8 F (36.6 C) (09/25 0826) Pulse Rate:  [88-95] 91 (09/25 0826) Resp:  [16] 16 (09/25 0413) BP: (112-132)/(73-81) 131/78 (09/25 0826) SpO2:  [87 %-96 %] 96 % (09/25 0826) Weight:  [59.5 kg] 59.5 kg (09/25 0500) Weight change: 3.435 kg Last BM Date: 06/02/20  Intake/Output from previous day: 09/24 0701 - 09/25 0700 In: 1863.9 [P.O.:480; I.V.:1283.9; IV Piggyback:100] Out: 0  No intake/output data recorded.   Physical Exam: General- pt is awake,alert, follows commands Resp- No acute REsp distress, CTA B/L NO Rhonchi CVS- S1S2 regular in rate and rhythm GIT- BS+, soft, NT, ND EXT- NO LE Edema, Cyanosis   Lab Results: CBC Recent Labs    06/02/20 0343 06/04/20 1052  WBC 10.3 12.6*  HGB 11.9* 11.5*  HCT 34.3* 33.5*  PLT 323 451*    BMET Recent Labs    06/03/20 0604 06/03/20 0907 06/04/20 0508 06/04/20 0856  NA 132*    < > 135 135  K 3.3*  --  3.2*  --   CL 97*  --  98  --   CO2 25  --  26  --   GLUCOSE 130*  --  125*  --   BUN 5*  --  12  --   CREATININE 0.48  --  0.46  --   CALCIUM 8.3*  --  8.6*  --    < > = values in this interval not displayed.    MICRO Recent Results (from the past 240 hour(s))  Culture, Urine     Status: Abnormal   Collection Time: 05/30/20 11:58 AM   Specimen: Urine, Random  Result Value Ref Range Status   Specimen Description   Final    URINE, RANDOM Performed at [facility], [address] [place], [place] [address]    Special Requests   Final    NONE Performed at [facility], [address] [place], [place] [address]    Culture MULTIPLE SPECIES PRESENT, SUGGEST RECOLLECTION (A)  Final   Report Status 05/31/2020 FINAL  Final  SARS Coronavirus 2 by RT PCR (hospital order, performed in [facility] hospital lab) Nasopharyngeal Nasopharyngeal Swab     Status: Abnormal   Collection Time: 05/30/20  1:23 PM   Specimen: Nasopharyngeal Swab  Result Value Ref Range Status   SARS  Coronavirus 2 POSITIVE (A) NEGATIVE Final    Comment: RESULT CALLED TO, READ BACK BY AND VERIFIED WITH: ANNA JASPER 05/30/20 AT 1450 BY ACR (NOTE) SARS-CoV-2 target nucleic acids are DETECTED  SARS-CoV-2 RNA is generally detectable in upper respiratory specimens  during the acute phase of infection.  Positive results are indicative  of the presence of the identified virus, but do not rule out bacterial infection or co-infection with other pathogens not detected by the test.  Clinical correlation with patient history and  other diagnostic information is necessary to determine patient infection status.  The expected result is negative.  Fact Sheet for Patients:   BoilerBrush.com.cy   Fact Sheet for Healthcare Providers:   https://pope.com/    This test is not yet approved or cleared by the [place] FDA and  has been  authorized for detection and/or diagnosis of SARS-CoV-2 by FDA under an Emergency Use Authorization (EUA).  This EUA will remain in effect (meaning this te st can be used) for the duration of  the COVID-19 declaration under Section 564(b)(1) of the Act, 21 U.S.C. section 360-bbb-3(b)(1), unless the authorization is terminated or revoked sooner.  Performed at [facility], [address] [place], [place] [address]       Lab Results  Component Value Date   CALCIUM 8.6 (L) 06/04/2020   PHOS 2.1 (L) 06/02/2020               Impression:   Karina Swanson is a 59 year old Caucasian female with no significant medical history, who was admitted to [facility] on9/20/2021for  Hyponatremia Hypokalemia  Covid+,  Dehydration Acute metabolic encephalopathy  1) hyponatremia Patient has hypobulimic hypotonic true hyponatremia Patient required hypertonic saline at the time of presentation Patient sodium at the time of admission was 110 on September 28 it is now 135 today. Patient sodium is much better  2) hypokalemia Patient is on potassium repletion Patient potassium is improving  3)Anemia  HGb at goal (9--11)   4) elevated LFTs Now better  5) Covid positive Patient is clinically stable  6) Acid base Co2 at goal     Plan:   We will continue current care    Karina Swanson s Wolfgang Phoenix 06/04/2020, 4:11 PM

## 2020-06-05 LAB — C-REACTIVE PROTEIN: CRP: 1.1 mg/dL — ABNORMAL HIGH (ref <1.0)

## 2020-06-05 LAB — SODIUM: Sodium: 134 mmol/L — ABNORMAL LOW (ref 135–145)

## 2020-06-05 LAB — LACTATE DEHYDROGENASE: LDH: 235 U/L — ABNORMAL HIGH (ref 98–192)

## 2020-06-05 LAB — PHOSPHORUS: Phosphorus: 3.5 mg/dL (ref 2.5–4.6)

## 2020-06-05 LAB — FIBRIN DERIVATIVES D-DIMER (ARMC ONLY): Fibrin derivatives D-dimer (ARMC): 941.81 ng/mL (FEU) — ABNORMAL HIGH (ref 0.00–499.00)

## 2020-06-05 LAB — MAGNESIUM: Magnesium: 2 mg/dL (ref 1.7–2.4)

## 2020-06-05 LAB — FERRITIN: Ferritin: 782 ng/mL — ABNORMAL HIGH (ref 11–307)

## 2020-06-05 MED ORDER — SODIUM CHLORIDE 0.9 % IV SOLN: INTRAVENOUS | Status: DC

## 2020-06-05 NOTE — Progress Notes (Signed)
Pt refuses ordered po zinc stating it makes her feel sick

## 2020-06-05 NOTE — Progress Notes (Signed)
PROGRESS NOTE    Karina Swanson  ZOX:[id number] DOB: July 17, 1961 DOA: 05/30/2020 PCP: Karina Blamer, MD   Brief Narrative:  59 y.o. Female admitted with Acute Metabolic Encephalopathy in the setting of Hypotonic Hypovolemic Hyponatremia requiring Hypertonic Saline.  Also found to have COVID-19, but pt without hypoxia or respiratory symptoms, and has normal CXR. Nephrology consulted and managing pt's severe hyponatremia with mental status changes. Level has been steadily Improving.  Per notes, she has been having about a 2 week history of fever/chills, cough.  She has a COVID test performed last week on 05/26/2020 by her PCP, but results are not available.  She was reported to be lethargic and confused with poor PO intake since last night, therefore her family brought her to ED.  Upon presentation to the ED, her vital signs are within normal limits (RR 16, HR 85, BP 119/69, 100% SpO2 on room air), and no signs of respiratory symptoms.  Initial workup revealed serum Na 110, potassium 2.9, BUN <5, Creatinine < 0.35, glucose 143, AST 86, ALT 51, WBC 10.2 with Neutrophilia, procalcitonin 0.18, serum osmolality 237, urine osmolality 457, and Urine Na 19.  Her COVID-19 PCR is positive.  Her CXR is normal.  Urinalysis with trace leukocytes.  Nephrology was consulted who recommended placing on 3% Hypertonic Saline infusion at 20 cc/hr.  PCCM is asked to admit the pt to ICU for further workup and treatment of Acute Metabolic Encephalopathy in the setting of Hypotonic Hypovolemic Hyponatremia requiring 3% Hypertonic Saline.   Blood pressure 130/81, pulse 95, temperature 98.7 F (37.1 C), resp. rate 16, height 5' (1.524 m), weight 59.5 kg, SpO2 95 %.   Physical Exam Vitals reviewed.  Constitutional:      Appearance: Normal appearance. She is normal weight.  HENT:     Head: Normocephalic and atraumatic.     Right Ear: External ear normal.     Left Ear: External ear normal.  Eyes:      Extraocular Movements: Extraocular movements intact.  Cardiovascular:     Rate and Rhythm: Normal rate and regular rhythm.     Pulses: Normal pulses.     Heart sounds: Normal heart sounds.  Pulmonary:     Effort: Pulmonary effort is normal.     Breath sounds: Normal breath sounds.  Abdominal:     Palpations: Abdomen is soft.  Skin:    General: Skin is warm.  Neurological:     General: No focal deficit present.     Mental Status: She is alert and oriented to person, place, and time. Mental status is at baseline.  Psychiatric:        Mood and Affect: Mood normal.    Assessment & Plan:   Principal Problem:   Acute hypoxemic respiratory failure due to COVID-19 [facility]) Active Problems:   Hyponatremia  A/C hypoxic respiratory failure due to covid-19 : -Pt has started to require oxygen 2L [address] to keep her o2 sats above 90%. Her oxygen saturation dropped today to 88% and was started on 2L [place]. -prone position for improved oxygenation.  -pt started on solumedrol after getting permission from son Karina Swanson.  -family hesitant towards remdesivir and per Dr.kasa's note Baricitinab when pt admitted due to side effect panel. -yesterday we did cta chest which was negative for pe and pt has responded well to remdesivir and solumedrol. Pt has been proning and have called son and d/w him good new and plan for discharge. Family does not want mom to be discharged until weekend  Is over.   COVID-19 Labs  Recent Labs    06/03/20 0604 06/04/20 0508 06/05/20 0328  FERRITIN 786* 808* 782*  LDH 254* 220* 235*  CRP  --  2.0* 1.1*    Lab Results  Component Value Date   SARSCOV2NAA POSITIVE (A) 05/30/2020   SpO2: 95 % O2 Flow Rate (L/min): 3 L/min   COVID-19 Labs  Recent Labs    06/03/20 0604 06/04/20 0508 06/05/20 0328  FERRITIN 786* 808* 782*  LDH 254* 220* 235*  CRP  --  2.0* 1.1*    Lab Results  Component Value Date   SARSCOV2NAA POSITIVE (A) 05/30/2020  SpO2: 95 % O2 Flow  Rate (L/min): 3 L/min  Hyponatremia: continue ivf at 50 cc hour. Serum sodium check. Pt is stable neurologically and anticipate d/c tomorrow to home PT consult appreciated. Cont ivf.resolved.   Hypokalemia: Replaced and will follow. Po replacement.   Transamnitis : Present for admission is improving. Improving.   DVT prophylaxis: lovenox. Code Status: Full Family Communication: johhny Disposition Plan: home with PT.  Status is: Inpatient  Dispo: The patient is from: Home              Anticipated d/c is to: Home              Anticipated d/c date is: > 3 days              Patient currently is not medically stable to d/c. Consultants:   Nephrology   Antimicrobials:  Anti-infectives (From admission, onward)   Start     Dose/Rate Route Frequency Ordered Stop   06/04/20 1000  azithromycin (ZITHROMAX) tablet 500 mg        500 mg Oral  Once 06/03/20 1216 06/04/20 1114   06/04/20 1000  cefdinir (OMNICEF) capsule 300 mg        300 mg Oral Every 12 hours 06/03/20 1216 06/04/20 2119   06/03/20 1000  remdesivir 100 mg in sodium chloride 0.9 % 100 mL IVPB       "Followed by" Linked Group Details   100 mg 200 mL/hr over 30 Minutes Intravenous Daily 06/02/20 2109 06/07/20 0959   06/02/20 2200  remdesivir 200 mg in sodium chloride 0.9% 250 mL IVPB       "Followed by" Linked Group Details   200 mg 580 mL/hr over 30 Minutes Intravenous Once 06/02/20 2109 06/02/20 2229   05/31/20 0800  azithromycin (ZITHROMAX) 500 mg in sodium chloride 0.9 % 250 mL IVPB  Status:  Discontinued        500 mg 250 mL/hr over 60 Minutes Intravenous Every 24 hours 05/31/20 0720 06/03/20 1216   05/31/20 0800  cefTRIAXone (ROCEPHIN) 1 g in sodium chloride 0.9 % 100 mL IVPB  Status:  Discontinued        1 g 200 mL/hr over 30 Minutes Intravenous Every 24 hours 05/31/20 0720 06/03/20 1216       Subjective: Pt is alert and awake and is stable but weak from her Gastroenteritis attributed to covid-19  infection. PT has been asymptomatic and came for hyponatremia and was initially treated with hypertonic saline which has been d./c and pt is on ns.   9/23 Pt is stable on 2L [place] and CRP is improving and we will follow.  CTA chest to rule out PE and any bacterial infection.  9/24 Pt is alert awake and oriented and denies any complaints is proning and labs are improving.   9/25 Pt seen today and waking  make her hypoxic and dizzy so she loses her balance.  I have walked with pt around nurses station and she was not dyspneic or sob.  9/26 Pt is alert and oriented and is much clearer today mentally and  Her dizziness did not recur. Pt states she fees little better. She is on RA at rest and o2 sat are 92-93%.   Objective: Vitals:   06/05/20 0321 06/05/20 0500 06/05/20 0802 06/05/20 1200  BP: 121/82  121/72 130/81  Pulse: (!) 102  94 95  Resp: 16     Temp: 98.2 F (36.8 C)  98.6 F (37 C) 98.7 F (37.1 C)  TempSrc: Oral     SpO2: 91%  94% 95%  Weight:  59.5 kg    Height:       No intake or output data in the 24 hours ending 06/05/20 1254 Filed Weights   06/03/20 0400 06/04/20 0500 06/05/20 0500  Weight: 56.1 kg 59.5 kg 59.5 kg    Examination: Blood pressure 130/81, pulse 95, temperature 98.7 F (37.1 C), resp. rate 16, height 5' (1.524 m), weight 59.5 kg, SpO2 95 %. General exam: Appears calm and comfortable  Respiratory system: Clear to auscultation. Respiratory effort normal. Cardiovascular system: RRR. No JVD, murmurs, rubs, gallops or clicks. No pedal edema. Gastrointestinal system: Abdomen is nondistended, soft and nontender. No organomegaly or masses felt. Normal bowel sounds heard. Central nervous system: Alert and oriented. No focal neurological deficits. Extremities: Symmetric 5 x 5 power. Skin: No rashes, lesions or ulcers Psychiatry: Judgement and insight appear normal. Mood & affect appropriate.   Data Reviewed: I have personally reviewed following labs and  imaging studies  I/O last 3 completed shifts: In: 240 [P.O.:240] Out: -  No intake/output data recorded. Lab Results  Component Value Date   CREATININE 0.46 06/04/2020   CREATININE 0.48 06/03/2020   CREATININE 0.51 06/02/2020   CBC: Recent Labs  Lab 05/30/20 1156 05/31/20 0357 06/01/20 0625 06/02/20 0343 06/04/20 1052  WBC 10.2 12.1* 9.3 10.3 12.6*  NEUTROABS 8.6* 9.7* 7.4 8.1*  --   HGB 12.8 13.0 13.0 11.9* 11.5*  HCT 34.5* 35.8* 36.5 34.3* 33.5*  MCV 77.9* 78.3* 80.0 81.7 81.9  PLT 255 295 311 323 451*   Basic Metabolic Panel: Recent Labs  Lab 06/01/20 0625 06/01/20 0901 06/01/20 2300 06/01/20 2300 06/02/20 0343 06/02/20 0843 06/03/20 0604 06/03/20 0907 06/03/20 2053 06/04/20 0508 06/04/20 0856 06/04/20 2031 06/05/20 0328 06/05/20 0817  NA 123*   < > 127*   < > 131*   < > 132*   < > 135 135 135 134*  --  134*  K 3.1*  --  3.3*  --  3.3*  --  3.3*  --   --  3.2*  --   --   --   --   CL 86*  --  91*  --  95*  --  97*  --   --  98  --   --   --   --   CO2 24  --  25  --  26  --  25  --   --  26  --   --   --   --   GLUCOSE 97  --  100*  --  90  --  130*  --   --  125*  --   --   --   --   BUN 10  --  8  --  7  --  5*  --   --  12  --   --   --   --   CREATININE 0.64  --  0.52  --  0.51  --  0.48  --   --  0.46  --   --   --   --   CALCIUM 8.6*  --  8.4*  --  8.1*  --  8.3*  --   --  8.6*  --   --   --   --   MG  --   --   --   --  1.9  --   --   --   --   --   --   --  2.0  --   PHOS  --   --   --   --  2.1*  --   --   --   --   --   --   --  3.5  --    < > = values in this interval not displayed.   GFR: Estimated Creatinine Clearance: 61.1 mL/min (by C-G formula based on SCr of 0.46 mg/dL). Liver Function Tests: Recent Labs  Lab 05/31/20 0357 06/01/20 0625 06/02/20 0343 06/03/20 0604 06/04/20 0508  AST 141* 113* 65* 44* 34  ALT 93* 87* 65* 52* 49*  ALKPHOS 85 85 74 71 62  BILITOT 0.8 0.8 0.8 0.5 0.5  PROT 7.0 6.9 6.2* 6.0* 5.9*  ALBUMIN 3.4* 3.3*  2.9* 2.7* 2.7*   CBG: Recent Labs  Lab 05/30/20 1228 06/01/20 2014  GLUCAP 149* 106*   Anemia Panel: Recent Labs    06/04/20 0508 06/05/20 0328  FERRITIN 808* 782*   Sepsis Labs: Recent Labs  Lab 06/01/20 0625 06/02/20 0843 06/03/20 0604 06/04/20 0508  PROCALCITON 0.17 <0.10 <0.10 <0.10    Recent Results (from the past 240 hour(s))  Culture, Urine     Status: Abnormal   Collection Time: 05/30/20 11:58 AM   Specimen: Urine, Random  Result Value Ref Range Status   Specimen Description   Final    URINE, RANDOM Performed at [facility], [address], [place], [place] [address]    Special Requests   Final    NONE Performed at [facility], [address] [place], [place] [address]    Culture MULTIPLE SPECIES PRESENT, SUGGEST RECOLLECTION (A)  Final   Report Status 05/31/2020 FINAL  Final  SARS Coronavirus 2 by RT PCR (hospital order, performed in [facility] hospital lab) Nasopharyngeal Nasopharyngeal Swab     Status: Abnormal   Collection Time: 05/30/20  1:23 PM   Specimen: Nasopharyngeal Swab  Result Value Ref Range Status   SARS Coronavirus 2 POSITIVE (A) NEGATIVE Final    Comment: RESULT CALLED TO, READ BACK BY AND VERIFIED WITH: ANNA JASPER 05/30/20 AT 1450 BY ACR (NOTE) SARS-CoV-2 target nucleic acids are DETECTED  SARS-CoV-2 RNA is generally detectable in upper respiratory specimens  during the acute phase of infection.  Positive results are indicative  of the presence of the identified virus, but do not rule out bacterial infection or co-infection with other pathogens not detected by the test.  Clinical correlation with patient history and  other diagnostic information is necessary to determine patient infection status.  The expected result is negative.  Fact Sheet for Patients:   BoilerBrush.com.cy   Fact Sheet for Healthcare Providers:   https://pope.com/    This test is not yet  approved or cleared by the [place] FDA  and  has been authorized for detection and/or diagnosis of SARS-CoV-2 by FDA under an Emergency Use Authorization (EUA).  This EUA will remain in effect (meaning this te st can be used) for the duration of  the COVID-19 declaration under Section 564(b)(1) of the Act, 21 U.S.C. section 360-bbb-3(b)(1), unless the authorization is terminated or revoked sooner.  Performed at [facility], [address], [place], [place] [address]      Radiology Studies: No results found.  Scheduled Meds: . vitamin C  1,000 mg Oral TID  . cholecalciferol  400 Units Oral Daily  . dextromethorphan-guaiFENesin  1 tablet Oral BID  . enoxaparin (LOVENOX) injection  40 mg Subcutaneous Q24H  . famotidine  20 mg Oral BID  . melatonin  5 mg Oral QHS  . methylPREDNISolone (SOLU-MEDROL) injection  40 mg Intravenous Q12H  . sodium chloride flush  3 mL Intravenous Q12H  . zinc sulfate  220 mg Oral Daily   Continuous Infusions: . sodium chloride Stopped (06/03/20 1521)  . sodium chloride Stopped (06/02/20 2357)  . remdesivir 100 mg in NS 100 mL 100 mg (06/05/20 0924)     LOS: 6 days    Gertha Calkin, MD [facility] Pager [phone] If 7PM-7AM, please contact night-coverage [email or web address] Password [facility] 06/05/2020, 12:54 PM

## 2020-06-05 NOTE — Progress Notes (Signed)
Karina Swanson  MRN: [id number]  DOB/AGE: June 29, 1961 59 y.o.  Primary Care Physician:Harris, Chrissie Noa, MD  Admit date: 05/30/2020  Chief Complaint:  Chief Complaint  Patient presents with  . Altered Mental Status    S-Pt presented on  05/30/2020 with  Chief Complaint  Patient presents with  . Altered Mental Status  . Patient offers no new complaints.  Patient resting comfortably in the bed. Patient question during today's visit was" what is my sodium.?"  I answered patient questions to the best of my ability  Medications . vitamin C  1,000 mg Oral TID  . cholecalciferol  400 Units Oral Daily  . dextromethorphan-guaiFENesin  1 tablet Oral BID  . enoxaparin (LOVENOX) injection  40 mg Subcutaneous Q24H  . famotidine  20 mg Oral BID  . melatonin  5 mg Oral QHS  . methylPREDNISolone (SOLU-MEDROL) injection  40 mg Intravenous Q12H  . sodium chloride flush  3 mL Intravenous Q12H  . zinc sulfate  220 mg Oral Daily         WNI:OEVOJ from the symptoms mentioned above,there are no other symptoms referable to all systems reviewed.  Physical Exam: Vital signs in last 24 hours: Temp:  [98.2 F (36.8 C)-98.6 F (37 C)] 98.6 F (37 C) (09/26 0802) Pulse Rate:  [94-102] 94 (09/26 0802) Resp:  [16-19] 16 (09/26 0321) BP: (121-128)/(72-89) 121/72 (09/26 0802) SpO2:  [90 %-94 %] 94 % (09/26 0802) Weight:  [59.5 kg] 59.5 kg (09/26 0500) Weight change: 0 kg Last BM Date: 06/02/20  Intake/Output from previous day: No intake/output data recorded. No intake/output data recorded.   Physical Exam: General- pt is awake,alert, follows commands Resp- No acute REsp distress, CTA B/L NO Rhonchi CVS- S1S2 regular in rate and rhythm GIT- BS+, soft, NT, ND EXT- NO LE Edema, Cyanosis   Lab Results: CBC Recent Labs    06/04/20 1052  WBC 12.6*  HGB 11.5*  HCT 33.5*  PLT 451*    BMET Recent Labs    06/03/20 0604 06/03/20 0907 06/04/20 0508 06/04/20 0856 06/04/20 2031  06/05/20 0817  NA 132*   < > 135   < > 134* 134*  K 3.3*  --  3.2*  --   --   --   CL 97*  --  98  --   --   --   CO2 25  --  26  --   --   --   GLUCOSE 130*  --  125*  --   --   --   BUN 5*  --  12  --   --   --   CREATININE 0.48  --  0.46  --   --   --   CALCIUM 8.3*  --  8.6*  --   --   --    < > = values in this interval not displayed.    MICRO Recent Results (from the past 240 hour(s))  Culture, Urine     Status: Abnormal   Collection Time: 05/30/20 11:58 AM   Specimen: Urine, Random  Result Value Ref Range Status   Specimen Description   Final    URINE, RANDOM Performed at [facility], [address] [place], [place] [address]    Special Requests   Final    NONE Performed at [facility], [address] [place], [place] [address]    Culture MULTIPLE SPECIES PRESENT, SUGGEST RECOLLECTION (A)  Final   Report Status 05/31/2020 FINAL  Final  SARS Coronavirus 2 by RT PCR (hospital order, performed in [facility] hospital lab) Nasopharyngeal Nasopharyngeal Swab     Status: Abnormal   Collection Time: 05/30/20  1:23 PM   Specimen: Nasopharyngeal Swab  Result Value Ref Range Status   SARS Coronavirus 2 POSITIVE (A) NEGATIVE Final    Comment: RESULT CALLED TO, READ BACK BY AND VERIFIED WITH: ANNA JASPER 05/30/20 AT 1450 BY ACR (NOTE) SARS-CoV-2 target nucleic acids are DETECTED  SARS-CoV-2 RNA is generally detectable in upper respiratory specimens  during the acute phase of infection.  Positive results are indicative  of the presence of the identified virus, but do not rule out bacterial infection or co-infection with other pathogens not detected by the test.  Clinical correlation with patient history and  other diagnostic information is necessary to determine patient infection status.  The expected result is negative.  Fact Sheet for Patients:   BoilerBrush.com.cy   Fact Sheet for Healthcare Providers:    https://pope.com/    This test is not yet approved or cleared by the [place] FDA and  has been authorized for detection and/or diagnosis of SARS-CoV-2 by FDA under an Emergency Use Authorization (EUA).  This EUA will remain in effect (meaning this te st can be used) for the duration of  the COVID-19 declaration under Section 564(b)(1) of the Act, 21 U.S.C. section 360-bbb-3(b)(1), unless the authorization is terminated or revoked sooner.  Performed at [facility], [address], [place], [place] [address]       Lab Results  Component Value Date   CALCIUM 8.6 (L) 06/04/2020   PHOS 3.5 06/05/2020               Impression:   [name] is a 59 year old Caucasian female with no significant medical history, who was admitted to [facility] on9/20/2021for  Hyponatremia Hypokalemia  Covid+,  Dehydration Acute metabolic encephalopathy  1) hyponatremia Patient has hypobulimic hypotonic true hyponatremia Patient required hypertonic saline at the time of presentation Patient sodium at the time of admission was 110 on September 28 it is now 134 today.  Patient sodium is much better  2) Hypokalemia Patient is on potassium repletion Patient potassium is improving  3)Anemia  HGb at goal (9--11)   4) elevated LFTs Now better  5) Covid positive Patient is clinically stable  6) Acid base Co2 at goal     Plan:   We will continue current care    Brentt Fread s [facility] 06/05/2020, 10:08 AM

## 2020-06-06 LAB — COMPREHENSIVE METABOLIC PANEL
ALT: 52 U/L — ABNORMAL HIGH (ref 0–44)
AST: 43 U/L — ABNORMAL HIGH (ref 15–41)
Albumin: 2.9 g/dL — ABNORMAL LOW (ref 3.5–5.0)
Alkaline Phosphatase: 66 U/L (ref 38–126)
Anion gap: 10 (ref 5–15)
BUN: 13 mg/dL (ref 6–20)
CO2: 25 mmol/L (ref 22–32)
Calcium: 8.8 mg/dL — ABNORMAL LOW (ref 8.9–10.3)
Chloride: 99 mmol/L (ref 98–111)
Creatinine, Ser: 0.5 mg/dL (ref 0.44–1.00)
GFR calc Af Amer: >60 mL/min (ref >60)
GFR calc non Af Amer: >60 mL/min (ref >60)
Glucose, Bld: 106 mg/dL — ABNORMAL HIGH (ref 70–99)
Potassium: 3.7 mmol/L (ref 3.5–5.1)
Sodium: 134 mmol/L — ABNORMAL LOW (ref 135–145)
Total Bilirubin: 0.5 mg/dL (ref 0.3–1.2)
Total Protein: 6 g/dL — ABNORMAL LOW (ref 6.5–8.1)

## 2020-06-06 LAB — FERRITIN: Ferritin: 672 ng/mL — ABNORMAL HIGH (ref 11–307)

## 2020-06-06 LAB — C-REACTIVE PROTEIN: CRP: 0.7 mg/dL (ref <1.0)

## 2020-06-06 LAB — CBC
HCT: 33.4 % — ABNORMAL LOW (ref 36.0–46.0)
Hemoglobin: 11.9 g/dL — ABNORMAL LOW (ref 12.0–15.0)
MCH: 28.3 pg (ref 26.0–34.0)
MCHC: 35.6 g/dL (ref 30.0–36.0)
MCV: 79.5 fL — ABNORMAL LOW (ref 80.0–100.0)
Platelets: 550 10*3/uL — ABNORMAL HIGH (ref 150–400)
RBC: 4.2 MIL/uL (ref 3.87–5.11)
RDW: 12.5 % (ref 11.5–15.5)
WBC: 10.6 10*3/uL — ABNORMAL HIGH (ref 4.0–10.5)
nRBC: 0 % (ref 0.0–0.2)

## 2020-06-06 LAB — FIBRIN DERIVATIVES D-DIMER (ARMC ONLY): Fibrin derivatives D-dimer (ARMC): 1130.1 ng/mL (FEU) — ABNORMAL HIGH (ref 0.00–499.00)

## 2020-06-06 LAB — LACTATE DEHYDROGENASE: LDH: 221 U/L — ABNORMAL HIGH (ref 98–192)

## 2020-06-06 MED ORDER — POTASSIUM CHLORIDE IN NACL 20-0.9 MEQ/L-% IV SOLN
INTRAVENOUS | Status: DC
  Filled 2020-06-06 (×3): qty 1000

## 2020-06-06 MED ORDER — PREDNISONE 10 MG PO TABS: ORAL_TABLET | ORAL | 0 refills | Status: AC

## 2020-06-06 NOTE — TOC Initial Note (Signed)
Transition of Care [facility]) - Initial/Assessment Note    Patient Details  Name: Karina Swanson MRN: [id number] Date of Birth: [birth year]  [facility]) CM/SW Contact:    Allayne Butcher, RN Phone Number: 06/06/2020, 11:42 AM  Clinical Narrative:                 Patient admitted to the hospital for COVID currently not requiring any supplemental oxygen.  Potential discharge for today.  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  Patient would like home health services at discharge and has no preference in agency.  [place] with [facility] has agreed to accept referral for RN and PT.  Patient is current with her PCP and drives.  No equipment needs at discharge.   Expected Discharge Plan: Home w Home Health Services Barriers to Discharge: Continued Medical Work up   Patient Goals and CMS Choice   CMS Medicare.gov Compare Post Acute Care list provided to:: Patient Choice offered to / list presented to : Patient  Expected Discharge Plan and Services Expected Discharge Plan: Home w Home Health Services   Discharge Planning Services: CM Consult Post Acute Care Choice: Home Health Living arrangements for the past 2 months: Single Family Home                   DME Agency: NA       HH Arranged: RN, PT HH Agency: [facility] Date HH Agency Contacted: 06/06/20 Time HH Agency Contacted: 1141 Representative spoke with at [facility] Agency: Christy Gentles  Prior Living Arrangements/Services Living arrangements for the past 2 months: Single Family Home Lives with:: Spouse Patient language and need for interpreter reviewed:: Yes Do you feel safe going back to the place where you live?: Yes      Need for Family Participation in Patient Care: Yes (Comment) (COVID) Care giver support system in place?: Yes (comment) (husband and son)   Criminal Activity/Legal Involvement Pertinent to Current Situation/Hospitalization: No - Comment as  needed  Activities of Daily Living Home Assistive Devices/Equipment: None ADL Screening (condition at time of admission) Patient's cognitive ability adequate to safely complete daily activities?: Yes Is the patient deaf or have difficulty hearing?: No Does the patient have difficulty seeing, even when wearing glasses/contacts?: No Does the patient have difficulty concentrating, remembering, or making decisions?: No Patient able to express need for assistance with ADLs?: Yes Does the patient have difficulty dressing or bathing?: No Independently performs ADLs?: Yes (appropriate for developmental age) Does the patient have difficulty walking or climbing stairs?: Yes Weakness of Legs: None Weakness of Arms/Hands: None  Permission Sought/Granted Permission sought to share information with : Case Manager, Family Supports, Other (comment) Permission granted to share information with : Yes, Verbal Permission Granted  Share Information with NAME: Weston Brass  Permission granted to share info w AGENCY: [facility]  Permission granted to share info w Relationship: husband     Emotional Assessment   Attitude/Demeanor/Rapport: Engaged Affect (typically observed): Accepting Orientation: : Oriented to Self, Oriented to Place, Oriented to  Time, Oriented to Situation Alcohol / Substance Use: Not Applicable Psych Involvement: No (comment)  Admission diagnosis:  Hyponatremia [E87.1] Altered mental status, unspecified altered mental status type [R41.82] Patient Active Problem List   Diagnosis Date Noted  . Acute hypoxemic respiratory failure due to COVID-19 (HCC) 06/02/2020  . Hyponatremia 05/30/2020   PCP:  Johny Blamer, MD Pharmacy:   [facility] [phone] [place] Rough,  [place] - [address] AT [facility] [address] [place] [address] Phone: [phone] Fax: [phone]     Social Determinants of Health (SDOH) Interventions    Readmission Risk  Interventions No flowsheet data found.

## 2020-06-06 NOTE — TOC Transition Note (Signed)
Transition of Care [facility]) - CM/SW Discharge Note   Patient Details  Name: Karina Swanson MRN: [id number] Date of Birth: [birth year]  Transition of Care [facility]) CM/SW Contact:  Allayne Butcher, RN Phone Number: 06/06/2020, 3:52 PM   Clinical Narrative:    Patient has been medically cleared for discharge home with home health services through [facility]  [place] with [facility] is aware of discharge home today.  Patient's husband will be able to pick her up today.    Final next level of care: Home w Home Health Services Barriers to Discharge: No Barriers Identified, Barriers Resolved   Patient Goals and CMS Choice Patient states their goals for this hospitalization and ongoing recovery are:: Glad to be going home today CMS Medicare.gov Compare Post Acute Care list provided to:: Patient Choice offered to / list presented to : Patient  Discharge Placement                       Discharge Plan and Services   Discharge Planning Services: CM Consult Post Acute Care Choice: Home Health            DME Agency: NA       HH Arranged: RN, PT [facility] Agency: [facility] Date [facility] Agency Contacted: 06/06/20 Time HH Agency Contacted: 1552 Representative spoke with at [facility] Agency: Christy Gentles  Social Determinants of Health (SDOH) Interventions     Readmission Risk Interventions No flowsheet data found.

## 2020-06-06 NOTE — Discharge Summary (Signed)
Physician Discharge Summary  Karina Swanson VOZ:[id number] DOB: 09/24/1960 DOA: 05/30/2020  PCP: Karina Blamer, MD  Admit date: 05/30/2020 Discharge date: 06/06/2020   Discharge Diagnoses:  Principal Problem: Acute hypoxemic respiratory failure due to COVID-19 [facility]): Active Problems: Hyponatremia    Discharge Condition:  Good.   Diet recommendation:  Regular Diet.  Filed Weights   06/03/20 0400 06/04/20 0500 06/05/20 0500  Weight: 56.1 kg 59.5 kg 59.5 kg    Discharge activity:  Per PT.   History of present illness:  Brief Narrative:  59 y.o. Female admitted with Acute Metabolic Encephalopathy in the setting of Hypotonic Hypovolemic Hyponatremia requiring Hypertonic Saline. Also found to have COVID-19, but pt without hypoxia or respiratory symptoms,and has normal CXR. Nephrology consulted and managing pt's severe hyponatremia with mental status changes. Level has been steadily Improving.  Per notes, she has been having about a 2 week history of fever/chills, cough. She has a COVID test performed last week on 9/16/2021by her PCP, but results are not available. She was reported to be lethargic and confused with poor PO intake since last night, therefore her family brought her to ED.  Upon presentation to the ED, her vital signs are within normal limits(RR 16, HR 85, BP 119/69,100% SpO2 on room air),and no signs of respiratory symptoms. Initial workup revealed serum Na 110, potassium 2.9, BUN <5, Creatinine <0.35, glucose 143, AST 86, ALT 51, WBC 10.2 with Neutrophilia, procalcitonin 0.18,serum osmolality 237, urine osmolality 457, and Urine Na 19. Her COVID-19 PCR is positive. Her CXR is normal. Urinalysis with trace leukocytes. Nephrology was consulted who recommended placing on 3% Hypertonic Saline infusion at 20 cc/hr.  PCCM is asked to admit the pt to ICU for further workup and treatment of Acute Metabolic Encephalopathy in the setting of Hypotonic  Hypovolemic Hyponatremia requiring 3% Hypertonic Saline.   Blood pressure 130/81, pulse 95, temperature 98.7 F (37.1 C), resp. rate 16, height 5' (1.524 m), weight 59.5 kg, SpO2 95 %.   Hospital Course:  Pt is alert and awake and is stable but weak from her Gastroenteritis attributed to covid-19 infection. PT has been asymptomatic and came for hyponatremia and was initially treated with hypertonic saline which has been d./c and pt is on ns.  9/23 Pt is stable on 2L  and CRP is improving and we will follow.  CTA chest to rule out PE and any bacterial infection.  9/24 Pt is alert awake and oriented and denies any complaints is proning and labs are improving.   9/25 Pt seen today and waking make her hypoxic and dizzy so she loses her balance.  I have walked with pt around nurses station and she was not dyspneic or sob.  9/26 Pt is alert and oriented and is much clearer today mentally and  Her dizziness did not recur. Pt states she fees little better. She is on RA at rest and o2 sat are 92-93%.  9/27 Pt is much better today and has completed her iv remdesivir and ambulating pusle ox after 6 min is normal. Pt is not dizzy and Denies any complaints today is eager to go home.   Procedures:  None  Consultations: None Discharge Exam: Vitals:   06/06/20 0803 06/06/20 1145  BP: 121/83 113/75  Pulse: 83 72  Resp: 16 16  Temp: 97.7 F (36.5 C) 98.6 F (37 C)  SpO2: 93% 94%   Physical Exam Vitals and nursing note reviewed.  HENT:     Head: Normocephalic.  Right Ear: External ear normal.     Left Ear: External ear normal.  Eyes:     Extraocular Movements: Extraocular movements intact.  Cardiovascular:     Rate and Rhythm: Normal rate and regular rhythm.     Pulses: Normal pulses.     Heart sounds: Normal heart sounds.  Pulmonary:     Effort: Pulmonary effort is normal.     Breath sounds: Normal breath sounds.  Abdominal:     Palpations: Abdomen is soft.   Neurological:     General: No focal deficit present.     Mental Status: She is alert.  Psychiatric:        Mood and Affect: Mood normal.    Discharge Instructions   Discharge Instructions    Call MD for:  difficulty breathing, headache or visual disturbances   Complete by: As directed    Call MD for:  extreme fatigue   Complete by: As directed    Call MD for:  hives   Complete by: As directed    Call MD for:  persistant dizziness or light-headedness   Complete by: As directed    Call MD for:  persistant nausea and vomiting   Complete by: As directed    Call MD for:  redness, tenderness, or signs of infection (pain, swelling, redness, odor or green/yellow discharge around incision site)   Complete by: As directed    Call MD for:  severe uncontrolled pain   Complete by: As directed    Call MD for:  temperature >100.4   Complete by: As directed    Diet - low sodium heart healthy   Complete by: As directed    Increase activity slowly   Complete by: As directed      Allergies as of 06/06/2020      Reactions   Codeine Nausea Only, Nausea And Vomiting      Medication List    STOP taking these medications   ibuprofen 200 MG tablet Commonly known as: ADVIL   ondansetron 4 MG disintegrating tablet Commonly known as: ZOFRAN-ODT   promethazine 25 MG tablet Commonly known as: PHENERGAN     TAKE these medications   acetaminophen 325 MG tablet Commonly known as: TYLENOL Take 650 mg by mouth every 6 (six) hours as needed.   predniSONE 10 MG tablet Commonly known as: DELTASONE Take 5 tablets day 1, followed by 4 tablets day 2, followed by 3 tablets day 3, 2 tablets day 4, and 1 tablet day 5, then stop   vitamin C 250 MG tablet Commonly known as: ASCORBIC ACID Take 250 mg by mouth daily.   Vitamin D 50 MCG (2000 UT) tablet Take 2,000 Units by mouth daily.            Durable Medical Equipment  (From admission, onward)         Start     Ordered   06/06/20  1528  DME Walker  Once       Question Answer Comment  Walker: With 5 Inch Wheels   Patient needs a walker to treat with the following condition Respiratory failure with hypoxia (HCC)      06/06/20 1527         Allergies  Allergen Reactions  . Codeine Nausea Only and Nausea And Vomiting    Follow-up Information    Karina Blamer, MD. Call in 1 week(s).   Specialty: Family Medicine Why: 1 week hospital followup appt.  Contact information: [address]  [address] [place] [address] [phone]                The results of significant diagnostics from this hospitalization (including imaging, microbiology, ancillary and laboratory) are listed below for reference.    Significant Diagnostic Studies: DG Chest 1 View  Result Date: 06/02/2020 CLINICAL DATA:  Acute respiratory failure.  COVID. EXAM: CHEST  1 VIEW COMPARISON:  05/31/2020. FINDINGS: Mediastinum and hilar structures normal. Heart size normal. Diffuse bilateral interstitial prominence. Pneumonitis could present this fashion. No pleural effusion or pneumothorax. IMPRESSION: Diffuse bilateral interstitial prominence consistent with pneumonitis. Similar findings noted on prior exam. Electronically Signed   By: Maisie Fushomas  Register   On: 06/02/2020 13:58   DG Chest 2 View  Result Date: 05/30/2020 CLINICAL DATA:  Cough. EXAM: CHEST - 2 VIEW COMPARISON:  None. FINDINGS: The heart size and mediastinal contours are within normal limits. Both lungs are clear. The visualized skeletal structures are unremarkable. IMPRESSION: No active cardiopulmonary disease. Electronically Signed   By: Lupita RaiderJames  Green Jr M.D.   On: 05/30/2020 12:50   CT ANGIO CHEST PE W OR WO CONTRAST  Result Date: 06/02/2020 CLINICAL DATA:  Respiratory failure EXAM: CT ANGIOGRAPHY CHEST WITH CONTRAST TECHNIQUE: Multidetector CT imaging of the chest was performed using the standard protocol during bolus administration of intravenous contrast. Multiplanar CT  image reconstructions and MIPs were obtained to evaluate the vascular anatomy. CONTRAST:  75mL OMNIPAQUE IOHEXOL 350 MG/ML SOLN COMPARISON:  None. FINDINGS: Cardiovascular: Contrast injection is sufficient to demonstrate satisfactory opacification of the pulmonary arteries to the segmental level. There is no pulmonary embolus or evidence of right heart strain. The size of the main pulmonary artery is normal. Heart size is normal, with no pericardial effusion. The course and caliber of the aorta are normal. There is no atherosclerotic calcification. Opacification decreased due to pulmonary arterial phase contrast bolus timing. Mediastinum/Nodes: -- No mediastinal lymphadenopathy. -- No hilar lymphadenopathy. -- No axillary lymphadenopathy. -- No supraclavicular lymphadenopathy. -- Normal thyroid gland where visualized. -  Unremarkable esophagus. Lungs/Pleura: There are diffuse bilateral ground-glass airspace opacities. There is no pneumothorax. No significant pleural effusion. There is atelectasis at the lung bases. Upper Abdomen: Contrast bolus timing is not optimized for evaluation of the abdominal organs. There is a small subcentimeter hyperdense nodule in the right hepatic lobe favored to represent a flash hemangioma. Musculoskeletal: No chest wall abnormality. No bony spinal canal stenosis. Review of the MIP images confirms the above findings. IMPRESSION: 1. No evidence of pulmonary embolus. 2. Diffuse bilateral ground-glass airspace opacities, consistent with the patient's history of viral pneumonia. Electronically Signed   By: Katherine Mantlehristopher  Green M.D.   On: 06/02/2020 19:14   DG Chest Port 1 View  Result Date: 05/31/2020 CLINICAL DATA:  Altered mental status EXAM: PORTABLE CHEST 1 VIEW COMPARISON:  05/30/2020 FINDINGS: The heart size and mediastinal contours are within normal limits. Both lungs are clear. The visualized skeletal structures are unremarkable. IMPRESSION: No active disease. Electronically  Signed   By: Helyn NumbersAshesh  Parikh MD   On: 05/31/2020 04:55    Microbiology: Recent Results (from the past 240 hour(s))  Culture, Urine     Status: Abnormal   Collection Time: 05/30/20 11:58 AM   Specimen: Urine, Random  Result Value Ref Range Status   Specimen Description   Final    URINE, RANDOM Performed at [facility], [address] [place], [place] [address]    Special Requests   Final    NONE Performed at [facility], [address] [place], [place] [address]    Culture MULTIPLE SPECIES PRESENT, SUGGEST RECOLLECTION (A)  Final   Report Status 05/31/2020 FINAL  Final  SARS Coronavirus 2 by RT PCR (hospital order, performed in [facility] hospital lab) Nasopharyngeal Nasopharyngeal Swab     Status: Abnormal   Collection Time: 05/30/20  1:23 PM   Specimen: Nasopharyngeal Swab  Result Value Ref Range Status   SARS Coronavirus 2 POSITIVE (A) NEGATIVE Final    Comment: RESULT CALLED TO, READ BACK BY AND VERIFIED WITH: ANNA JASPER 05/30/20 AT 1450 BY ACR (NOTE) SARS-CoV-2 target nucleic acids are DETECTED  SARS-CoV-2 RNA is generally detectable in upper respiratory specimens  during the acute phase of infection.  Positive results are indicative  of the presence of the identified virus, but do not rule out bacterial infection or co-infection with other pathogens not detected by the test.  Clinical correlation with patient history and  other diagnostic information is necessary to determine patient infection status.  The expected result is negative.  Fact Sheet for Patients:   BoilerBrush.com.cy   Fact Sheet for Healthcare Providers:   https://pope.com/    This test is not yet approved or cleared by the [place] FDA and  has been authorized for detection and/or diagnosis of SARS-CoV-2 by FDA under an Emergency Use Authorization (EUA).  This EUA will remain in effect (meaning this te st can be used) for the  duration of  the COVID-19 declaration under Section 564(b)(1) of the Act, 21 U.S.C. section 360-bbb-3(b)(1), unless the authorization is terminated or revoked sooner.  Performed at [facility], [address] [place], [place] [address]      Labs: Basic Metabolic Panel: Recent Labs  Lab 06/01/20 2300 06/01/20 2300 06/02/20 0343 06/02/20 0843 06/03/20 0604 06/03/20 0907 06/04/20 0508 06/04/20 0856 06/04/20 2031 06/05/20 0328 06/05/20 0817 06/06/20 0420  NA 127*   < > 131*   < > 132*   < > 135 135 134*  --  134* 134*  K 3.3*  --  3.3*  --  3.3*  --  3.2*  --   --   --   --  3.7  CL 91*  --  95*  --  97*  --  98  --   --   --   --  99  CO2 25  --  26  --  25  --  26  --   --   --   --  25  GLUCOSE 100*  --  90  --  130*  --  125*  --   --   --   --  106*  BUN 8  --  7  --  5*  --  12  --   --   --   --  13  CREATININE 0.52  --  0.51  --  0.48  --  0.46  --   --   --   --  0.50  CALCIUM 8.4*  --  8.1*  --  8.3*  --  8.6*  --   --   --   --  8.8*  MG  --   --  1.9  --   --   --   --   --   --  2.0  --   --   PHOS  --   --  2.1*  --   --   --   --   --   --  3.5  --   --    < > = values in this interval not displayed.   Liver Function Tests: Recent Labs  Lab 06/01/20 0625 06/02/20 0343 06/03/20 0604 06/04/20 0508 06/06/20 0420  AST 113* 65* 44* 34 43*  ALT 87* 65* 52* 49* 52*  ALKPHOS 85 74 71 62 66  BILITOT 0.8 0.8 0.5 0.5 0.5  PROT 6.9 6.2* 6.0* 5.9* 6.0*  ALBUMIN 3.3* 2.9* 2.7* 2.7* 2.9*   No results for input(s): LIPASE, AMYLASE in the last 168 hours. No results for input(s): AMMONIA in the last 168 hours. CBC: Recent Labs  Lab 05/31/20 0357 06/01/20 0625 06/02/20 0343 06/04/20 1052 06/06/20 0420  WBC 12.1* 9.3 10.3 12.6* 10.6*  NEUTROABS 9.7* 7.4 8.1*  --   --   HGB 13.0 13.0 11.9* 11.5* 11.9*  HCT 35.8* 36.5 34.3* 33.5* 33.4*  MCV 78.3* 80.0 81.7 81.9 79.5*  PLT 295 311 323 451* 550*   Cardiac Enzymes: No results for input(s): CKTOTAL,  CKMB, CKMBINDEX, TROPONINI in the last 168 hours. BNP: BNP (last 3 results) No results for input(s): BNP in the last 8760 hours.  ProBNP (last 3 results) No results for input(s): PROBNP in the last 8760 hours.  CBG: Recent Labs  Lab 06/01/20 2014  GLUCAP 106*      Time spent: 15 minutes  Signed:  Gertha Calkin MD.  [facility] 06/06/2020, 3:31 PM

## 2020-06-06 NOTE — Progress Notes (Signed)
SATURATION QUALIFICATIONS: (This note is used to comply with regulatory documentation for home oxygen)  Patient Saturations on Room Air at Rest = 95%  Patient Saturations on Room Air while Ambulating = 97%   Please briefly explain why patient needs home oxygen:  Patient able to ambulate 160 feet without oxygen and had no complaints of weakness, dizziness or SOB.

## 2022-03-10 IMAGING — DX DG CHEST 1V PORT
1 series · 1 of 1 positions shown · non-contrast
Comparison: 05/30/2020

CLINICAL DATA: Altered mental status

EXAM:
PORTABLE CHEST 1 VIEW

[chest ap]
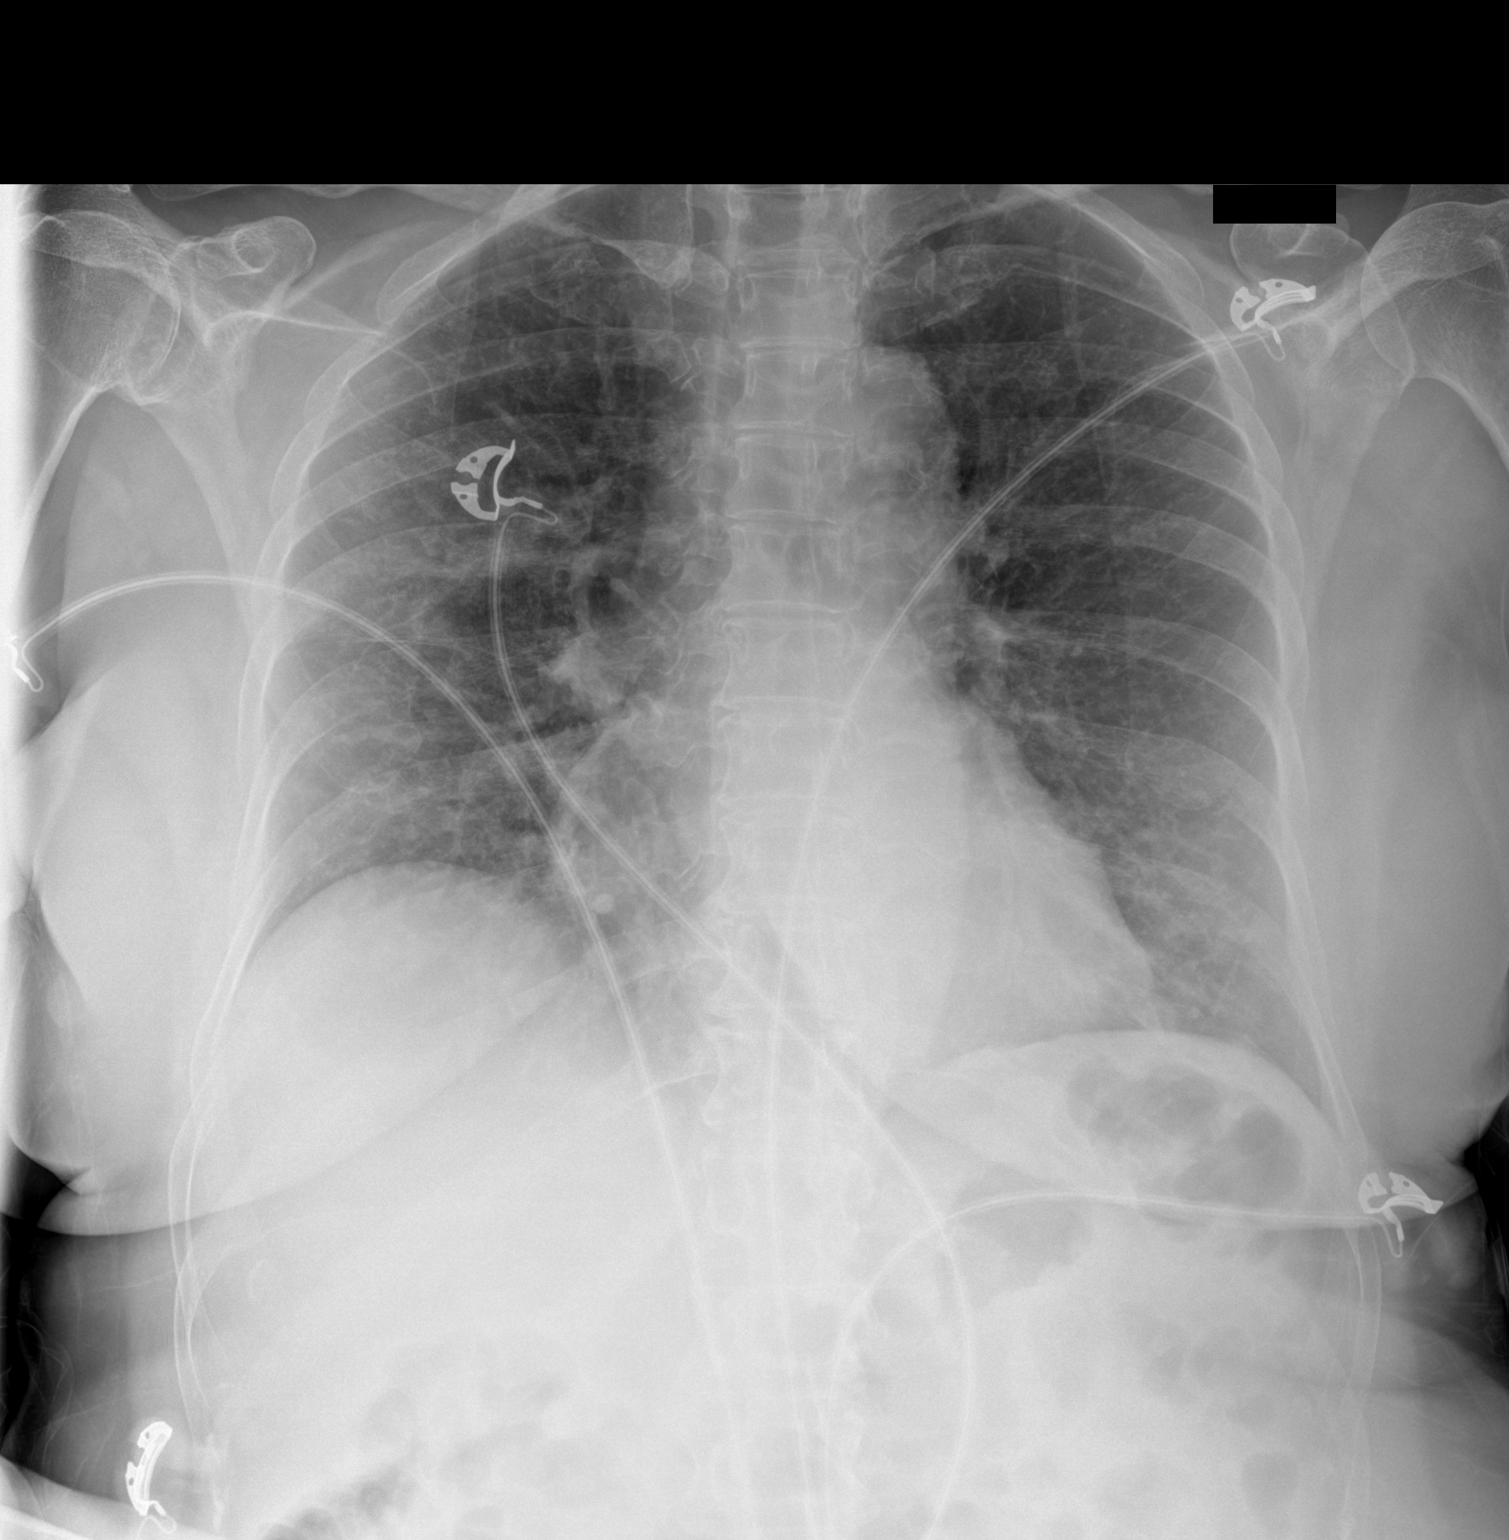

[1 of 1 positions shown; findings below may reference images not displayed]

FINDINGS: The heart size and mediastinal contours are within normal limits.
Both lungs are clear. The visualized skeletal structures are
unremarkable.
IMPRESSION: No active disease.

## 2022-03-12 IMAGING — CT CT ANGIO CHEST
2 of 6 series · 19 of 46 positions shown · IV contrast (omnipaque)
Comparison: None.

CLINICAL DATA: Respiratory failure

EXAM:
CT ANGIOGRAPHY CHEST WITH CONTRAST
TECHNIQUE: Multidetector CT imaging of the chest was performed using the
standard protocol during bolus administration of intravenous
contrast. Multiplanar CT image reconstructions and MIPs were
obtained to evaluate the vascular anatomy.
CONTRAST:  75mL OMNIPAQUE IOHEXOL 350 MG/ML SOLN

[Series 5: thins · axial · 0.61mm/px · z∈[-394,-162]mm · 17 of 254 slices shown]
[im 12/254  lung]
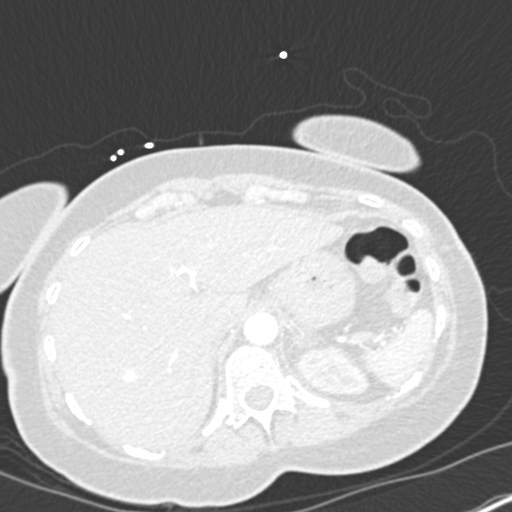
[im 23/254  soft-tissue]
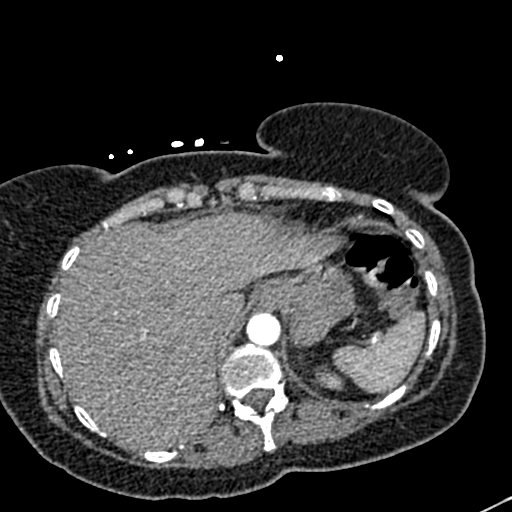
[im 45/254  lung]
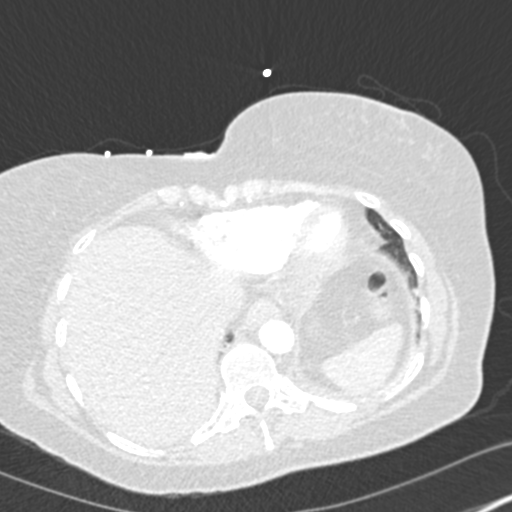
[im 56/254  soft-tissue]
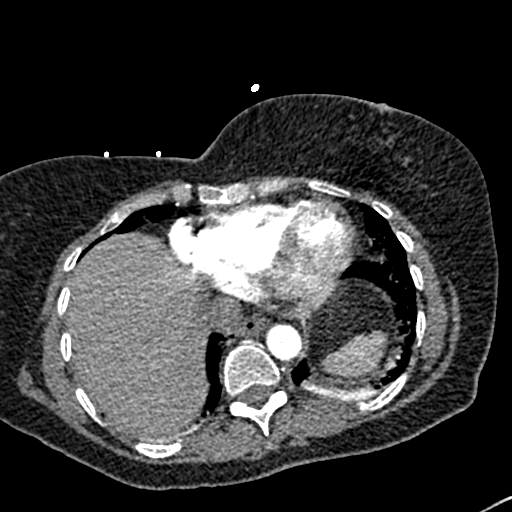
[im 67/254  lung]
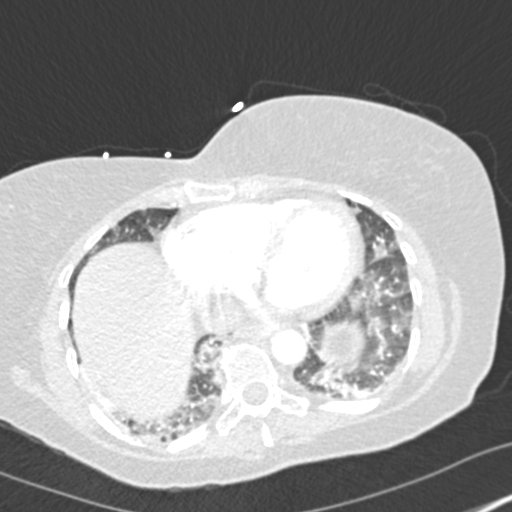
[im 89/254  soft-tissue]
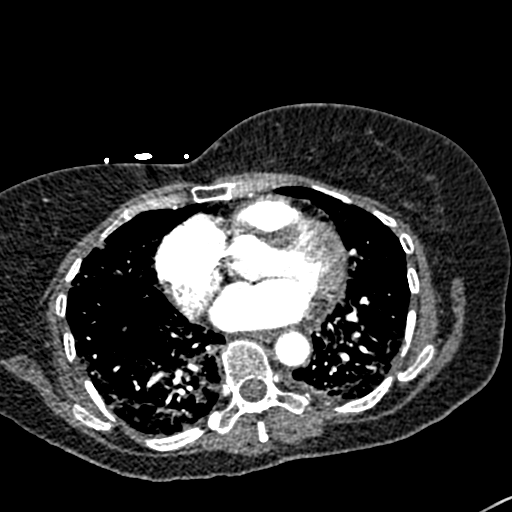
[im 100/254  lung]
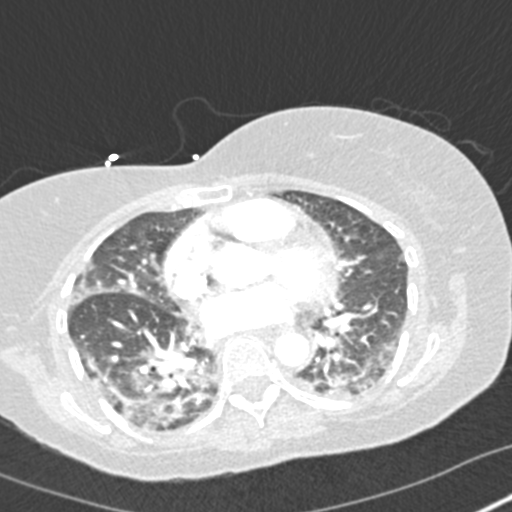
[im 111/254  soft-tissue]
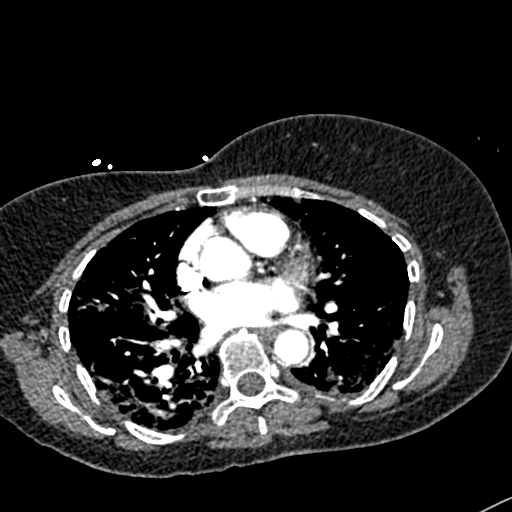
[im 133/254  lung]
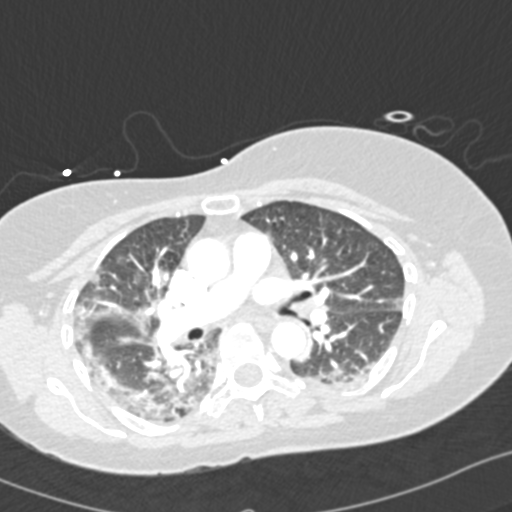
[im 144/254  soft-tissue]
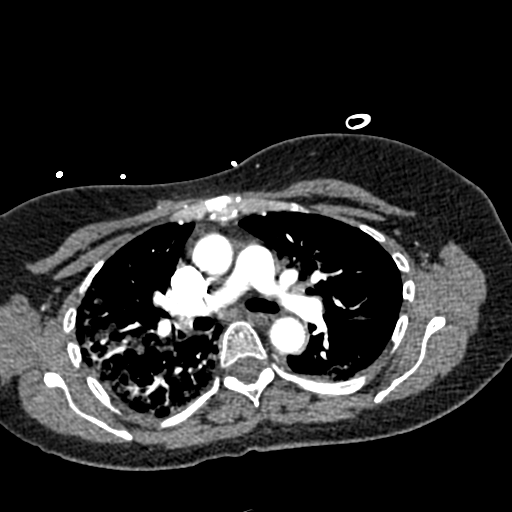
[im 155/254  lung]
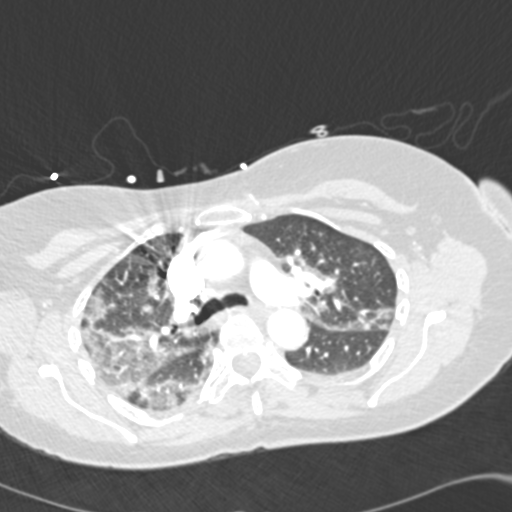
[im 166/254  soft-tissue]
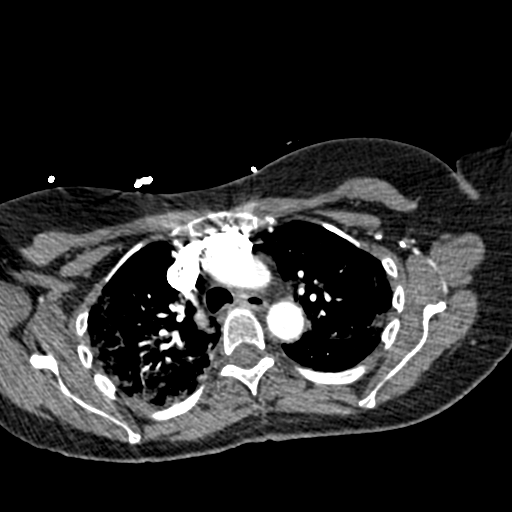
[im 188/254  lung]
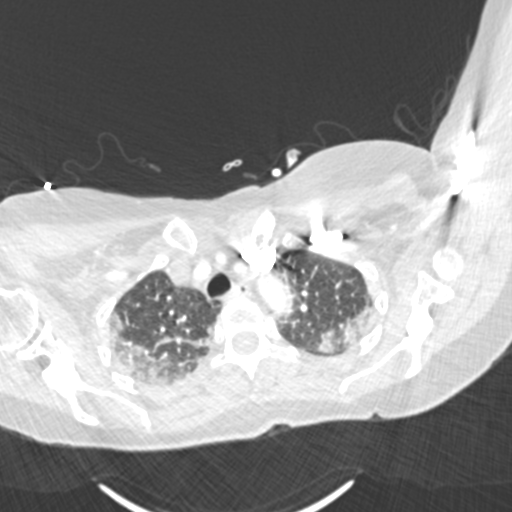
[im 199/254  soft-tissue]
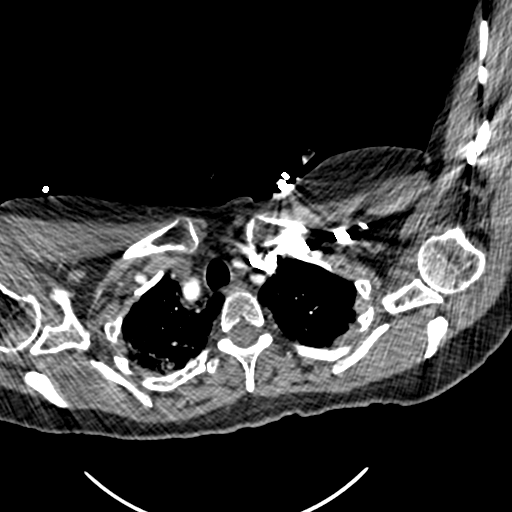
[im 210/254  lung]
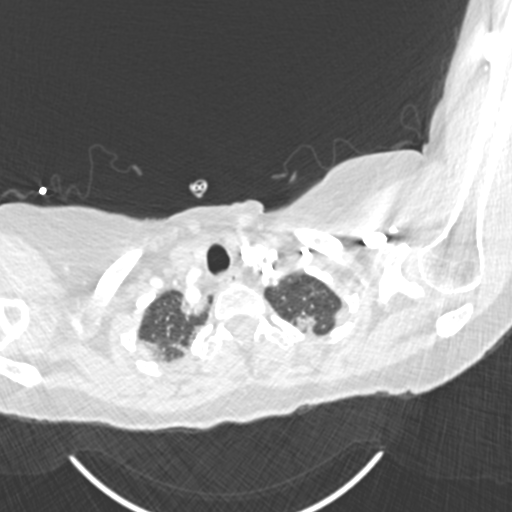
[im 232/254  soft-tissue]
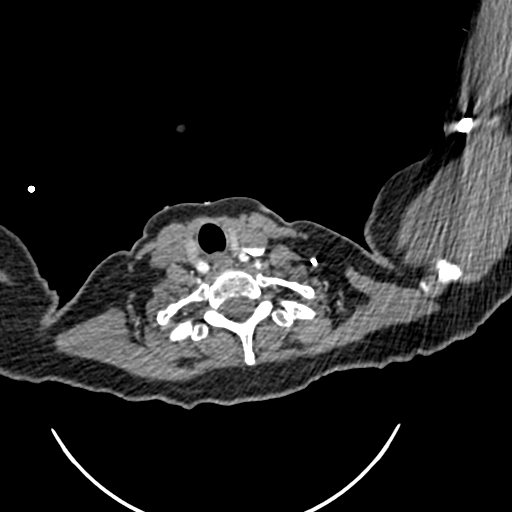
[im 243/254  lung]
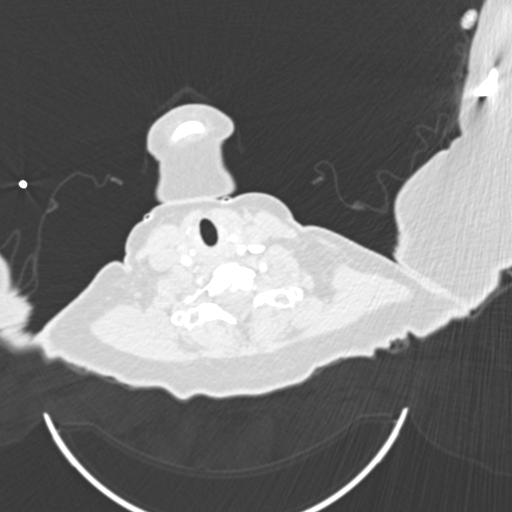

[Series 7: coronal mpr · coronal · 0.55mm/px · 2 of 72 slices shown]
[im 24/72  soft-tissue]
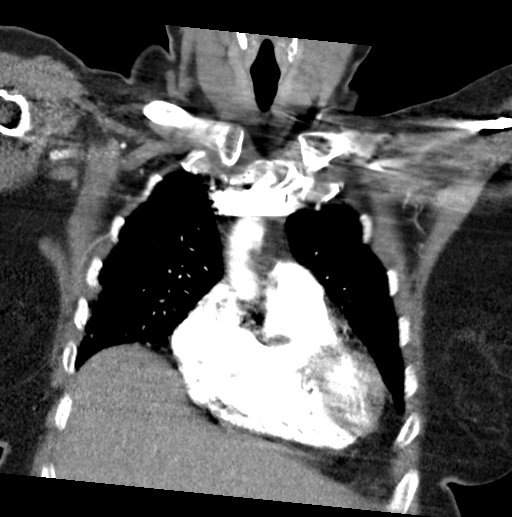
[im 48/72  soft-tissue]
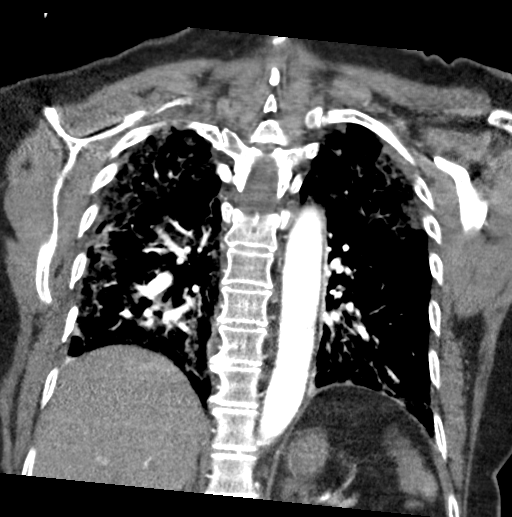

[19 of 46 positions shown; findings below may reference images not displayed]

FINDINGS: Cardiovascular: Contrast injection is sufficient to demonstrate
satisfactory opacification of the pulmonary arteries to the
segmental level. There is no pulmonary embolus or evidence of right
heart strain. The size of the main pulmonary artery is normal. Heart
size is normal, with no pericardial effusion. The course and caliber
of the aorta are normal. There is no atherosclerotic calcification.
Opacification decreased due to pulmonary arterial phase contrast
bolus timing.

Mediastinum/Nodes:

-- No mediastinal lymphadenopathy.

-- No hilar lymphadenopathy.

-- No axillary lymphadenopathy.

-- No supraclavicular lymphadenopathy.

-- Normal thyroid gland where visualized.

-  Unremarkable esophagus.

Lungs/Pleura: There are diffuse bilateral ground-glass airspace
opacities. There is no pneumothorax. No significant pleural
effusion. There is atelectasis at the lung bases.

Upper Abdomen: Contrast bolus timing is not optimized for evaluation
of the abdominal organs. There is a small subcentimeter hyperdense
nodule in the right hepatic lobe favored to represent a flash
hemangioma.

Musculoskeletal: No chest wall abnormality. No bony spinal canal
stenosis.

Review of the MIP images confirms the above findings.
IMPRESSION: 1. No evidence of pulmonary embolus.
2. Diffuse bilateral ground-glass airspace opacities, consistent
with the patient's history of viral pneumonia.

## 2022-03-12 IMAGING — DX DG CHEST 1V
1 series · 1 of 1 positions shown · non-contrast
Comparison: 05/31/2020.

CLINICAL DATA: Acute respiratory failure.  COVID.

EXAM:
CHEST  1 VIEW

[chest ap]
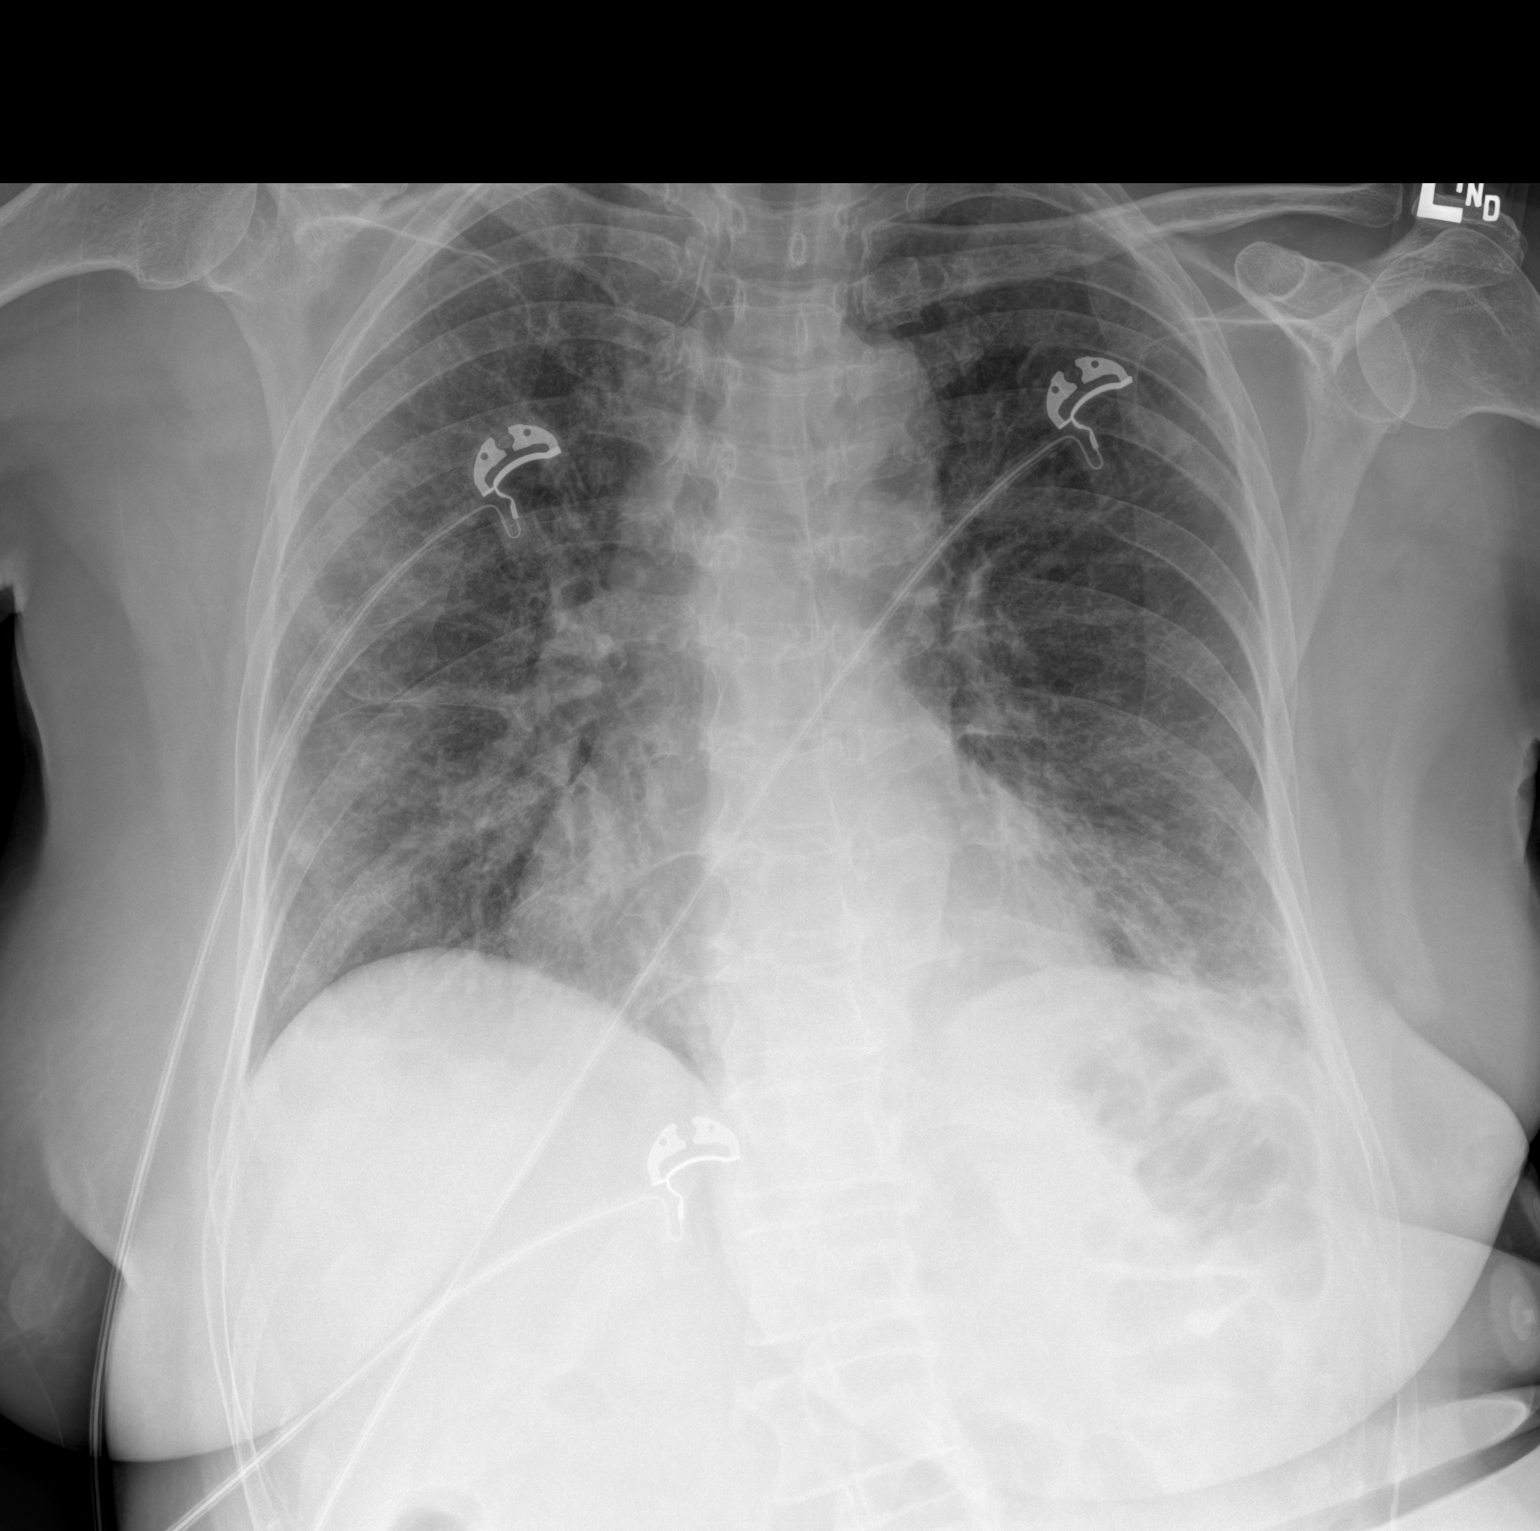

[1 of 1 positions shown; findings below may reference images not displayed]

FINDINGS: Mediastinum and hilar structures normal. Heart size normal. Diffuse
bilateral interstitial prominence. Pneumonitis could present this
fashion. No pleural effusion or pneumothorax.
IMPRESSION: Diffuse bilateral interstitial prominence consistent with
pneumonitis. Similar findings noted on prior exam.
